# Patient Record
Sex: Male | Born: 1980
Health system: Southern US, Community
[De-identification: ages and names within clinical notes are randomized; demographics above are authoritative.]

## PROBLEM LIST (undated history)

## (undated) DIAGNOSIS — I10 Essential (primary) hypertension: Secondary | ICD-10-CM

## (undated) DIAGNOSIS — E7800 Pure hypercholesterolemia, unspecified: Secondary | ICD-10-CM

## (undated) DIAGNOSIS — N289 Disorder of kidney and ureter, unspecified: Secondary | ICD-10-CM

---

## 2003-11-26 ENCOUNTER — Emergency Department (HOSPITAL_COMMUNITY): Admission: EM | Admit: 2003-11-26 | Discharge: 2003-11-26 | Payer: Self-pay | Admitting: Emergency Medicine

## 2004-03-19 ENCOUNTER — Emergency Department (HOSPITAL_COMMUNITY): Admission: EM | Admit: 2004-03-19 | Discharge: 2004-03-19 | Payer: Self-pay | Admitting: Emergency Medicine

## 2004-07-09 ENCOUNTER — Emergency Department (HOSPITAL_COMMUNITY): Admission: EM | Admit: 2004-07-09 | Discharge: 2004-07-09 | Payer: Self-pay | Admitting: Emergency Medicine

## 2004-09-09 ENCOUNTER — Emergency Department (HOSPITAL_COMMUNITY): Admission: EM | Admit: 2004-09-09 | Discharge: 2004-09-09 | Payer: Self-pay | Admitting: Emergency Medicine

## 2004-09-12 ENCOUNTER — Emergency Department (HOSPITAL_COMMUNITY): Admission: EM | Admit: 2004-09-12 | Discharge: 2004-09-12 | Payer: Self-pay | Admitting: Emergency Medicine

## 2004-09-16 ENCOUNTER — Inpatient Hospital Stay (HOSPITAL_COMMUNITY): Admission: AD | Admit: 2004-09-16 | Discharge: 2004-09-20 | Payer: Self-pay | Admitting: General Surgery

## 2004-09-22 ENCOUNTER — Ambulatory Visit (HOSPITAL_COMMUNITY): Admission: RE | Admit: 2004-09-22 | Discharge: 2004-09-22 | Payer: Self-pay | Admitting: Family Medicine

## 2005-07-26 ENCOUNTER — Emergency Department (HOSPITAL_COMMUNITY): Admission: EM | Admit: 2005-07-26 | Discharge: 2005-07-26 | Payer: Self-pay | Admitting: Emergency Medicine

## 2005-07-31 ENCOUNTER — Emergency Department (HOSPITAL_COMMUNITY): Admission: EM | Admit: 2005-07-31 | Discharge: 2005-07-31 | Payer: Self-pay | Admitting: Emergency Medicine

## 2006-09-19 IMAGING — CR DG CHEST 2V
2 series · 2 of 2 positions shown · non-contrast
Comparison: none

CLINICAL DATA: Fever, nausea, chest pain
 CHEST ? TWO VIEWS:

[view not recorded (1 of 2)]
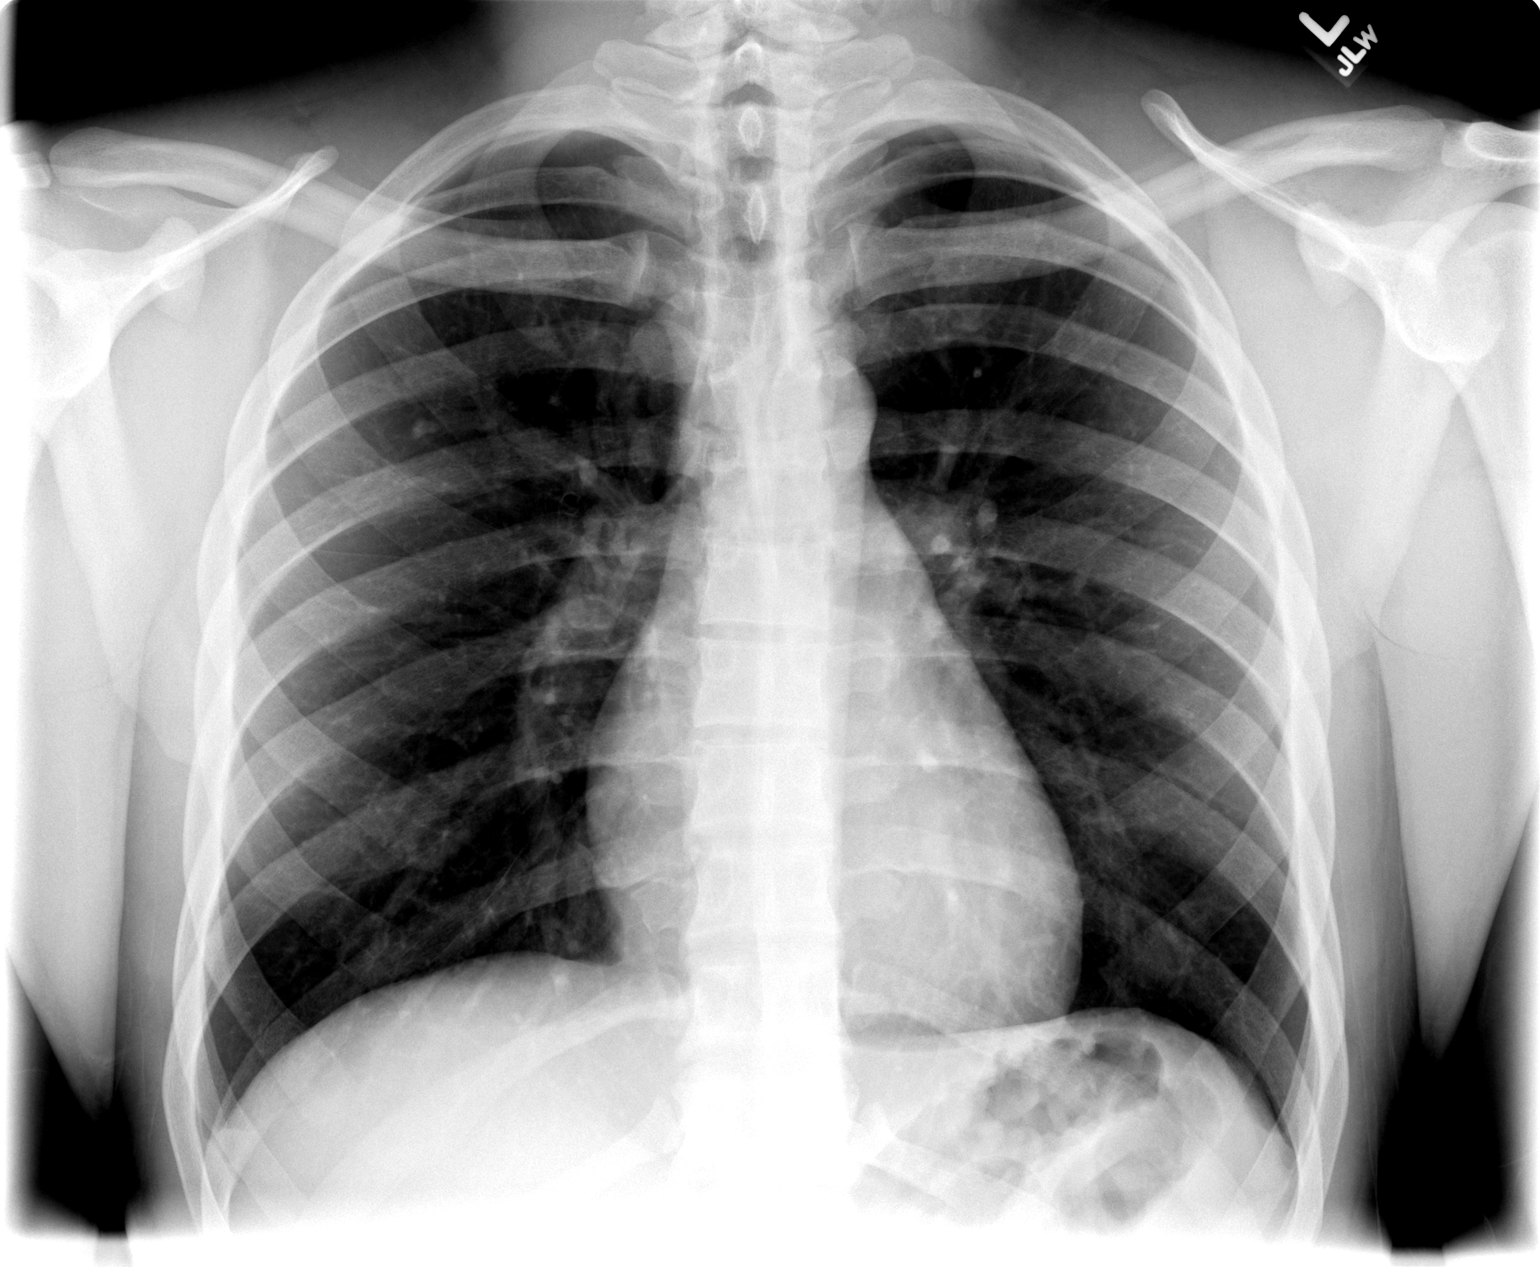

[view not recorded (2 of 2)]
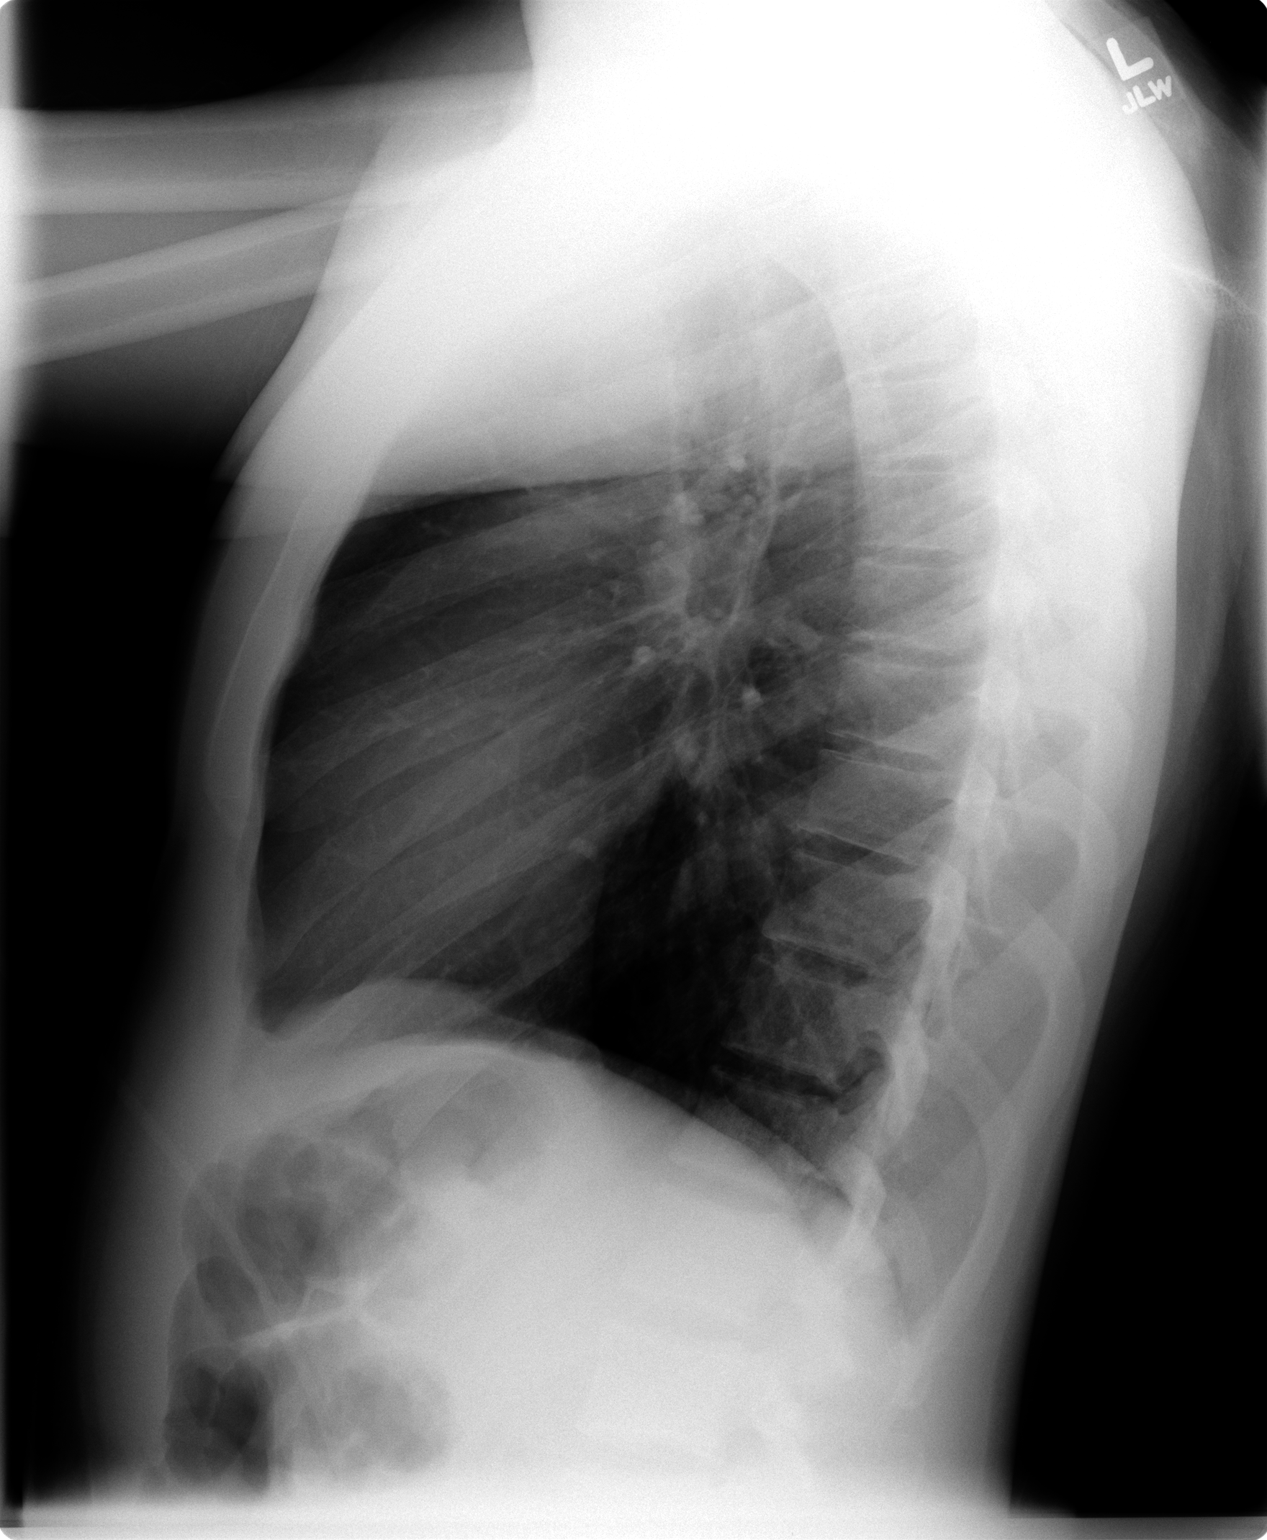

[2 of 2 positions shown; findings below may reference images not displayed]

FINDINGS: Two views of the chest show no active infiltrate or effusion.  Mild peribronchial thickening is noted.  The heart is within normal limits in size.  A small nodular opacity in the right upper lung may be vascular in origin and comparison with prior chest x-ray or followup chest x-ray is recommended.
IMPRESSION: No active lung disease.  Nodular opacity in right upper lung probably overlapping vessels.  Compare with prior or followup chest x-ray.

## 2006-09-24 IMAGING — US US RENAL
1 series · 14 of 25 positions shown · non-contrast
Comparison: none

HISTORY: Hematuria, sepsis, UTI

[Series 1: unknown · 0.27mm/px · 14 of 43 slices shown]
[im 1/43]
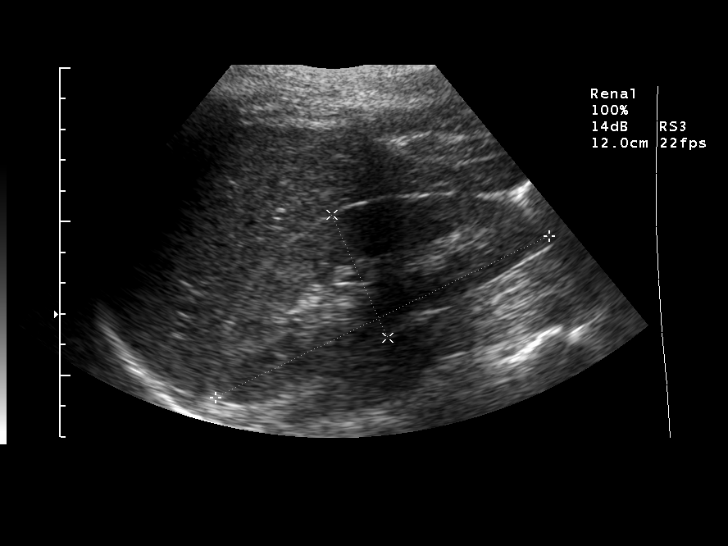
[im 4/43]
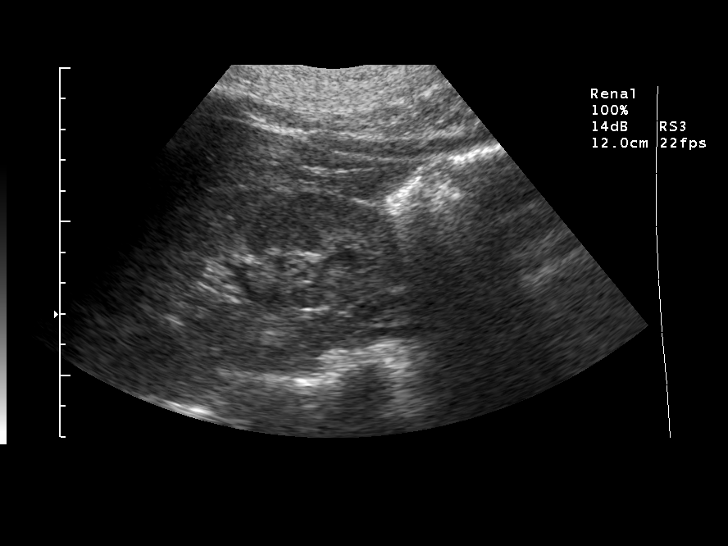
[im 8/43]
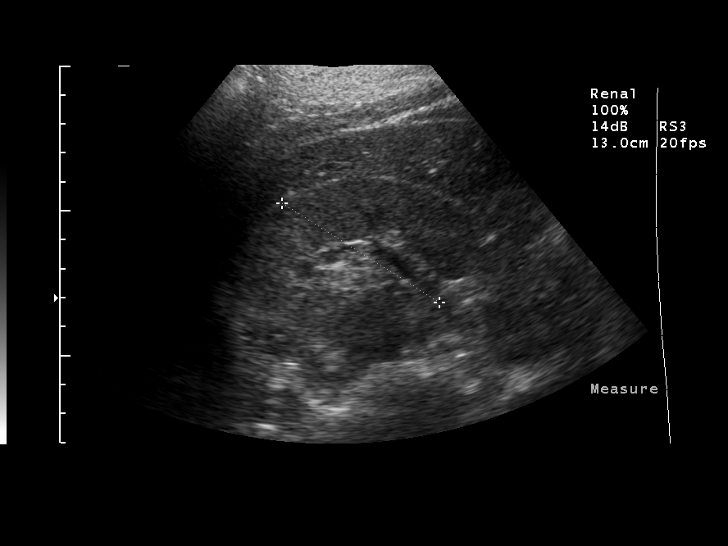
[im 11/43]
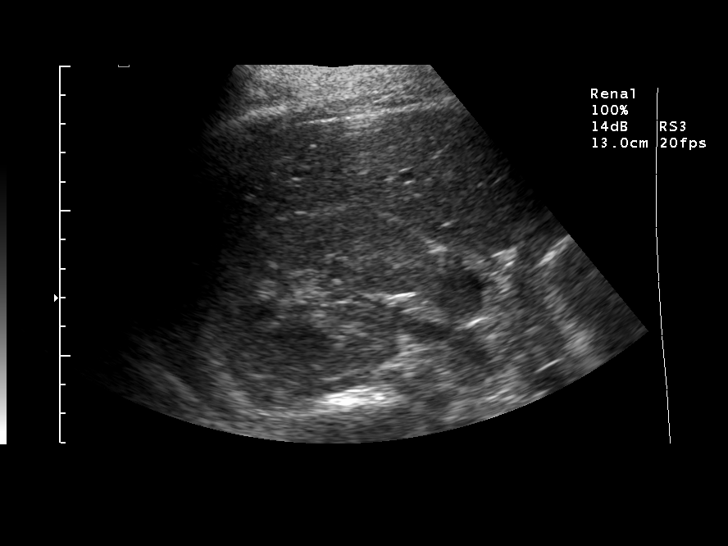
[im 15/43]
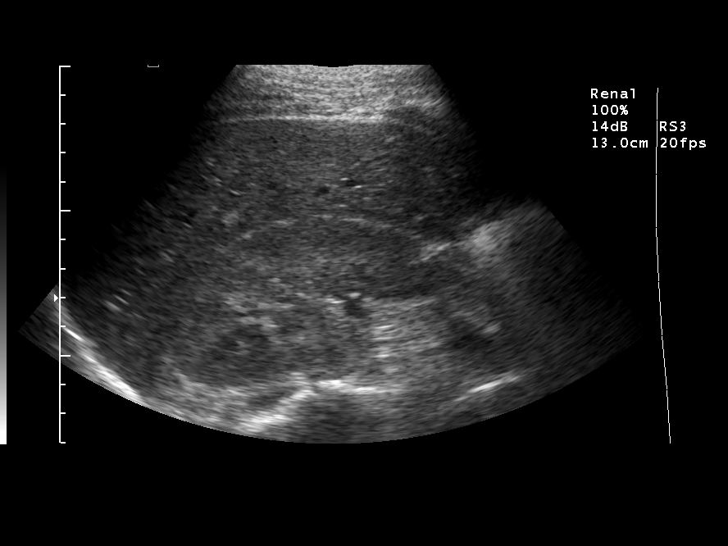
[im 16/43]
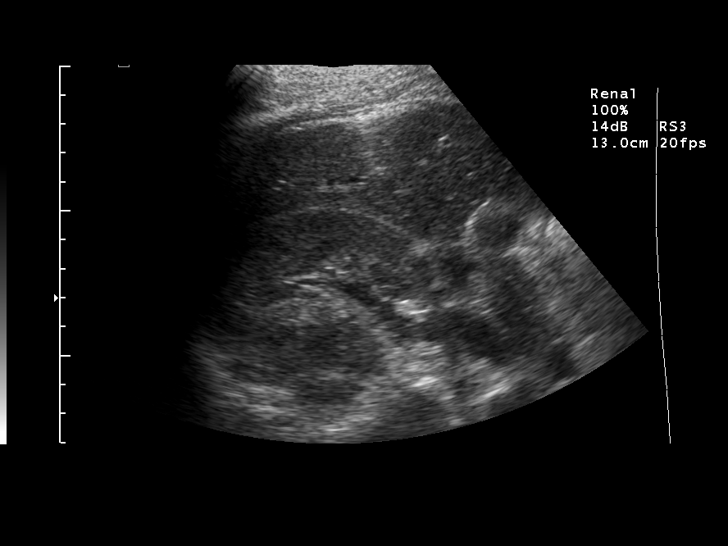
[im 20/43]
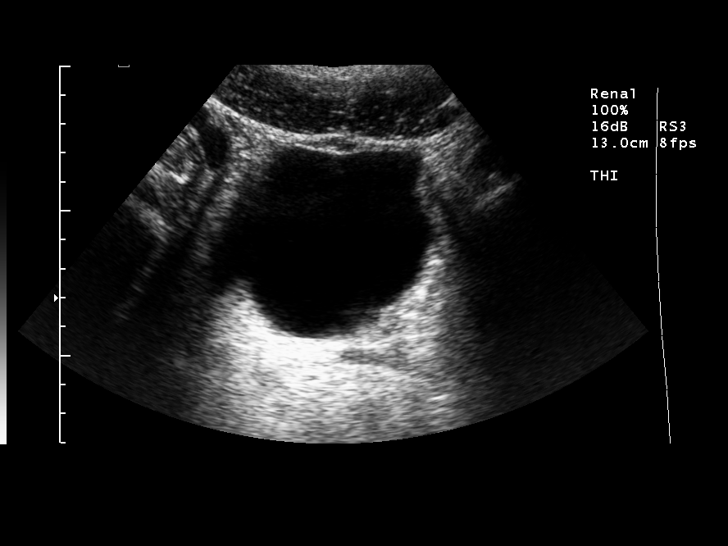
[im 23/43]
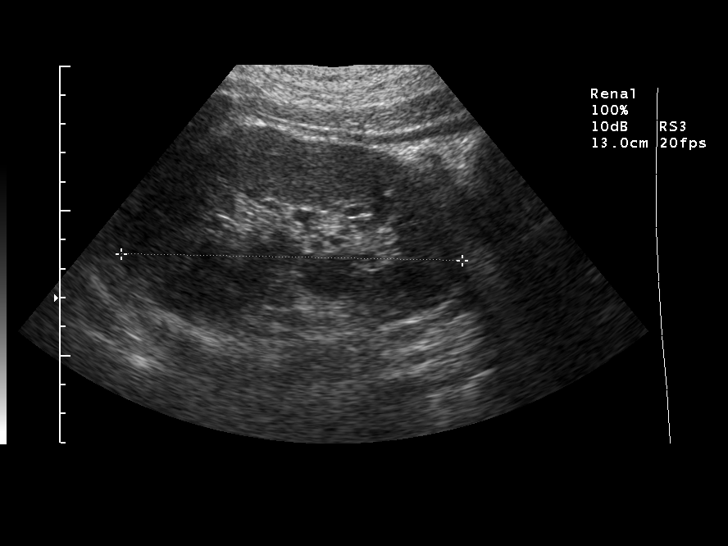
[im 27/43]
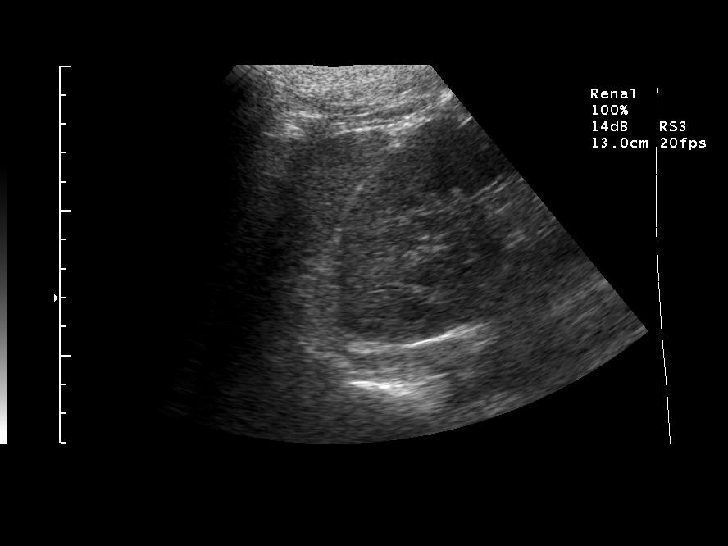
[im 29/43]
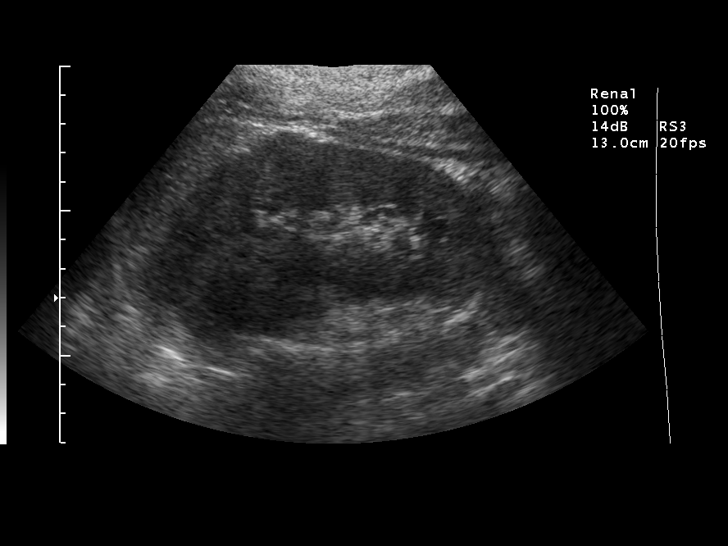
[im 32/43]
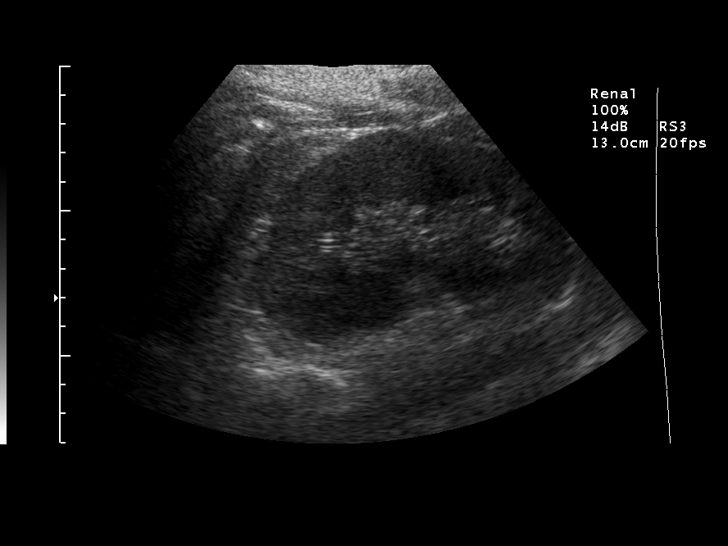
[im 36/43]
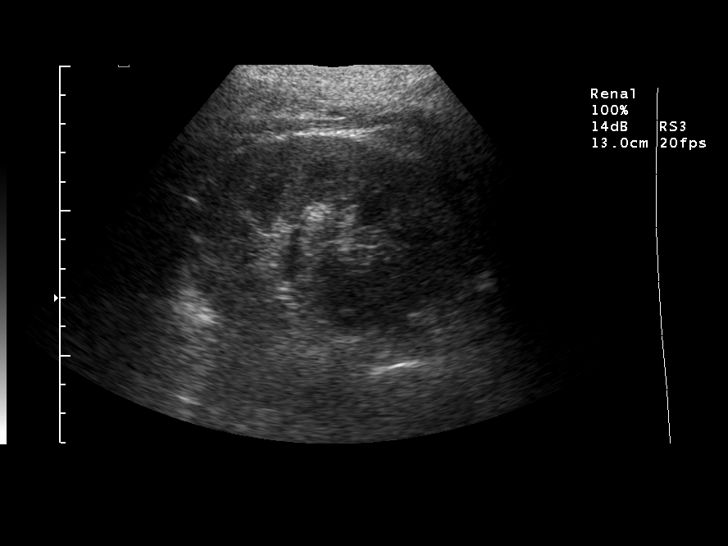
[im 39/43]
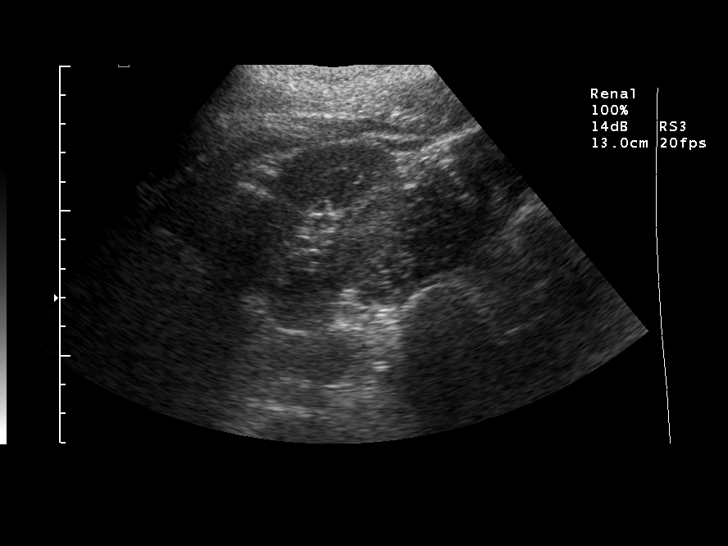
[im 43/43]
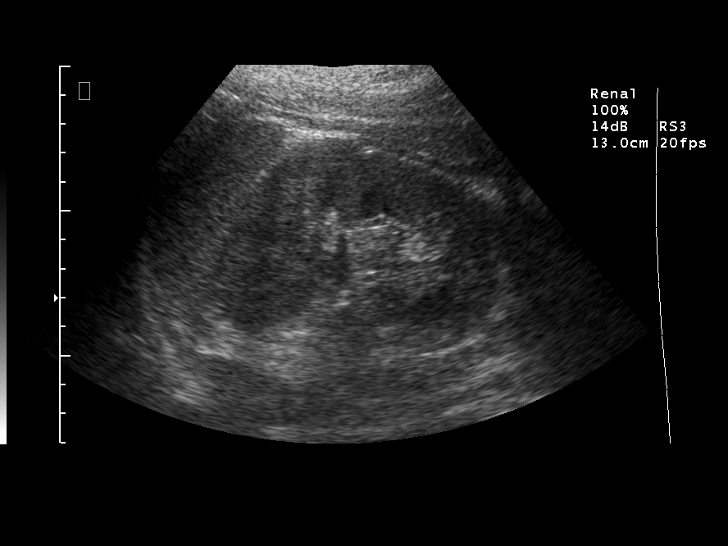

[14 of 25 positions shown; findings below may reference images not displayed]

RENAL ULTRASOUND:

Kidneys normal size, 12.0 cm length right and 11.7 cm left.
Upper normal right renal cortical echogenicity.
No solid mass or hydronephrosis.
Tiny nonshadowing echogenic foci in midleft kidney, without definite shadowing
calculus.
No perinephric fluid collection.
Bladder unremarkable.
IMPRESSION: No definite acute abnormalities as above.

## 2012-07-08 ENCOUNTER — Emergency Department (HOSPITAL_COMMUNITY)
Admission: EM | Admit: 2012-07-08 | Discharge: 2012-07-08 | Disposition: A | Discharge status: Home / Self Care | Payer: BC Managed Care – PPO | Attending: Emergency Medicine | Admitting: Emergency Medicine

## 2012-07-08 ENCOUNTER — Other Ambulatory Visit: Payer: Self-pay

## 2012-07-08 ENCOUNTER — Encounter (HOSPITAL_COMMUNITY): Payer: Self-pay | Admitting: *Deleted

## 2012-07-08 DIAGNOSIS — R55 Syncope and collapse: Secondary | ICD-10-CM | POA: Insufficient documentation

## 2012-07-08 DIAGNOSIS — Z872 Personal history of diseases of the skin and subcutaneous tissue: Secondary | ICD-10-CM | POA: Insufficient documentation

## 2012-07-08 DIAGNOSIS — F17200 Nicotine dependence, unspecified, uncomplicated: Secondary | ICD-10-CM | POA: Insufficient documentation

## 2012-07-08 DIAGNOSIS — R110 Nausea: Secondary | ICD-10-CM | POA: Insufficient documentation

## 2012-07-08 MED ORDER — IBUPROFEN 400 MG PO TABS
600.0000 mg | ORAL_TABLET | Freq: Once | ORAL | Status: AC
  Administered 2012-07-08: 600 mg via ORAL
  Filled 2012-07-08: qty 2

## 2012-07-08 NOTE — ED Notes (Signed)
Patient A/O x 3.  States prior to having abcess lanced, he was hypertensive and very anxious.  He only received Lidocaine as numbing agent.  Pt. Hypotensive upon EMS arrival at drug store.

## 2012-07-08 NOTE — ED Notes (Signed)
Patient with no complaints at this time. Respirations even and unlabored. Skin warm/dry. Discharge instructions reviewed with patient at this time. Patient given opportunity to voice concerns/ask questions. Patient discharged at this time and left Emergency Department with steady gait.   

## 2012-07-08 NOTE — ED Notes (Addendum)
Pt had abscess lanced to buttocks at Urgent Care. In car at drug store drive thru ,had syncopal episode.  Alert , appropriate now  Had not eaten prior to episode

## 2012-07-08 NOTE — ED Provider Notes (Signed)
History    32 year old male presenting after syncopal event. Patient had an abscess of his right buttock incised and drained earlier today at urgent care. He is prescribed antibiotics. He was at the pharmacy to get his antibiotics when he began to feel nauseated and lightheaded. He had a brief loss of consciousness. Denies any preceding chest pain or shortness of breath. Currently no complaints. Witnessed by family. No seizure activity. No tongue biting. No incontinence. Patient is otherwise healthy. No prior history of similar events.  CSN: [id number]  Arrival date & time 07/08/12  1452   First MD Initiated Contact with Patient 07/08/12 1612      Chief Complaint  Patient presents with  . Loss of Consciousness    (Consider location/radiation/quality/duration/timing/severity/associated sxs/prior treatment) HPI  History reviewed. No pertinent past medical history.  History reviewed. No pertinent past surgical history.  History reviewed. No pertinent family history.  History  Substance Use Topics  . Smoking status: Current Every Day Smoker  . Smokeless tobacco: Not on file  . Alcohol Use: No      Review of Systems  All systems reviewed and negative, other than as noted in HPI.    Allergies  Review of patient's allergies indicates no known allergies.  Home Medications  No current outpatient prescriptions on file.  BP 107/60  Pulse 84  Temp(Src) 100.6 F (38.1 C) (Oral)  Resp 20  Ht 5\' 11"  (1.803 m)  Wt 200 lb (90.719 kg)  BMI 27.91 kg/m2  SpO2 100%  Physical Exam  Nursing note and vitals reviewed. Constitutional: He appears well-developed and well-nourished. No distress.  HENT:  Head: Normocephalic and atraumatic.  Eyes: Conjunctivae are normal. Right eye exhibits no discharge. Left eye exhibits no discharge.  Neck: Neck supple.  Cardiovascular: Normal rate, regular rhythm and normal heart sounds.  Exam reveals no gallop and no friction rub.   No murmur  heard. Pulmonary/Chest: Effort normal and breath sounds normal. No respiratory distress.  Abdominal: Soft. He exhibits no distension. There is no tenderness.  Musculoskeletal: He exhibits no edema and no tenderness.  Lower extremities symmetric as compared to each other. No calf tenderness. Negative Homan's. No palpable cords.   Neurological: He is alert.  Skin: Skin is warm and dry.  Lesion to right buttock consistent with recently incised and drained abscess. No significant cellulitic component.  Psychiatric: He has a normal mood and affect. His behavior is normal. Thought content normal.    ED Course  Procedures (including critical care time)  Labs Reviewed - No data to display No results found.  EKG:  Rhythm: Normal sinus rhythm  Rate: 69 QRS duration: 100 ms. Incomplete right bundle branch block Axis: Normal ST segments: Very minimal ST segment elevation V1 and V2. No reciprocal changes. Comparison: None   1. Syncope       MDM  32 year old male with syncope. Possibly vasovagal related to pain her recent incision and drainage of a buttock abscess. Patient's EKG shows a sinus rhythm. Incomplete right bundle branch block pattern, but not typical of Brugada. Patient is febrile. Clinically he is very well-appearing though. He is hemodynamically stable. I do not feel that he is septic. He was prescribed antibiotics by previous provider. Instructed to take these as directed. Emergent return precautions were discussed. Continued wound care was discussed as well. Outpatient followup.        Raeford Razor, MD 07/08/12 [id number]

## 2018-01-11 ENCOUNTER — Emergency Department (HOSPITAL_COMMUNITY): Payer: BLUE CROSS/BLUE SHIELD

## 2018-01-11 ENCOUNTER — Encounter (HOSPITAL_COMMUNITY): Payer: Self-pay | Admitting: Emergency Medicine

## 2018-01-11 ENCOUNTER — Other Ambulatory Visit: Payer: Self-pay

## 2018-01-11 ENCOUNTER — Emergency Department (HOSPITAL_COMMUNITY)
Admission: EM | Admit: 2018-01-11 | Discharge: 2018-01-11 | Disposition: A | Discharge status: Home / Self Care | Payer: BLUE CROSS/BLUE SHIELD | Attending: Emergency Medicine | Admitting: Emergency Medicine

## 2018-01-11 DIAGNOSIS — R0600 Dyspnea, unspecified: Secondary | ICD-10-CM | POA: Diagnosis not present

## 2018-01-11 DIAGNOSIS — R0602 Shortness of breath: Secondary | ICD-10-CM | POA: Diagnosis not present

## 2018-01-11 DIAGNOSIS — J432 Centrilobular emphysema: Secondary | ICD-10-CM

## 2018-01-11 DIAGNOSIS — N179 Acute kidney failure, unspecified: Secondary | ICD-10-CM | POA: Insufficient documentation

## 2018-01-11 DIAGNOSIS — Z87891 Personal history of nicotine dependence: Secondary | ICD-10-CM | POA: Diagnosis not present

## 2018-01-11 HISTORY — DX: Disorder of kidney and ureter, unspecified: N28.9

## 2018-01-11 LAB — CBC WITH DIFFERENTIAL/PLATELET
ABS IMMATURE GRANULOCYTES: 0.07 10*3/uL (ref 0.00–0.07)
BASOS PCT: 1 %
Basophils Absolute: 0.1 10*3/uL (ref 0.0–0.1)
Eosinophils Absolute: 0.2 10*3/uL (ref 0.0–0.5)
Eosinophils Relative: 2 %
HCT: 50.4 % (ref 39.0–52.0)
Hemoglobin: 15.3 g/dL (ref 13.0–17.0)
IMMATURE GRANULOCYTES: 1 %
LYMPHS ABS: 2.8 10*3/uL (ref 0.7–4.0)
LYMPHS PCT: 35 %
MCH: 27.5 pg (ref 26.0–34.0)
MCHC: 30.4 g/dL (ref 30.0–36.0)
MCV: 90.5 fL (ref 80.0–100.0)
Monocytes Absolute: 0.9 10*3/uL (ref 0.1–1.0)
Monocytes Relative: 11 %
NEUTROS ABS: 4.2 10*3/uL (ref 1.7–7.7)
NEUTROS PCT: 50 %
PLATELETS: 273 10*3/uL (ref 150–400)
RBC: 5.57 MIL/uL (ref 4.22–5.81)
RDW: 14.6 % (ref 11.5–15.5)
WBC: 8.1 10*3/uL (ref 4.0–10.5)
nRBC: 0 % (ref 0.0–0.2)

## 2018-01-11 LAB — BASIC METABOLIC PANEL
ANION GAP: 6 (ref 5–15)
BUN: 14 mg/dL (ref 6–20)
CO2: 27 mmol/L (ref 22–32)
Calcium: 8.8 mg/dL — ABNORMAL LOW (ref 8.9–10.3)
Chloride: 105 mmol/L (ref 98–111)
Creatinine, Ser: 1.62 mg/dL — ABNORMAL HIGH (ref 0.61–1.24)
GFR calc Af Amer: >60 mL/min (ref >60)
GFR calc non Af Amer: 53 mL/min — ABNORMAL LOW (ref >60)
GLUCOSE: 91 mg/dL (ref 70–99)
POTASSIUM: 4 mmol/L (ref 3.5–5.1)
Sodium: 138 mmol/L (ref 135–145)

## 2018-01-11 LAB — TROPONIN I

## 2018-01-11 NOTE — ED Triage Notes (Signed)
Pt c/o sudden sob x 20 minutes ago. States felt heart beating fast. Denies feeling his heart beating fast now. States it would come and go  Denies pain/dizziness/nausea. No obvious sob noted. Lung sounds clear in triage

## 2018-01-11 NOTE — Discharge Instructions (Addendum)
Follow-up with your doctor for both the elevated blood pressure and the lung findings on the x-ray and CAT scan.

## 2018-01-11 NOTE — ED Provider Notes (Signed)
[facility] EMERGENCY DEPARTMENT Provider Note   CSN: [id number] Arrival date & time: 01/11/18  5732     History   Chief Complaint Chief Complaint  Patient presents with  . Shortness of Breath    HPI Micheal Carrillo is a 37 y.o. male.  HPI Patient presented with shortness of breath.  Lasted about 20 minutes at home prior to arrival.  States he just walked upstairs began to feel short of breath.  States he felt as if his heart was racing fast.  States he felt anxious.  Has not had a history of anxiety.  States he had smoked some marijuana about an hour prior to this.  He has not had any problems with that in the past.  States he went outside to get some air felt better came back and began again.  Now feeling back to baseline.  No swelling in his legs.  No chest pain.  Has not had episodes like this before.  No weight loss.  No swelling in his legs. Past Medical History:  Diagnosis Date  . Renal disorder     There are no active problems to display for this patient.   History reviewed. No pertinent surgical history.      Home Medications    Prior to Admission medications   Not on File    Family History History reviewed. No pertinent family history.  Social History Social History   Tobacco Use  . Smoking status: Former Smoker    Last attempt to quit: 04/13/2013    Years since quitting: 4.7  . Smokeless tobacco: Never Used  Substance Use Topics  . Alcohol use: No  . Drug use: Yes    Types: Marijuana    Comment: last used today     Allergies   Patient has no known allergies.   Review of Systems Review of Systems  Constitutional: Negative for chills and unexpected weight change.  HENT: Negative for congestion.   Respiratory: Positive for shortness of breath.   Cardiovascular: Positive for palpitations.  Gastrointestinal: Negative for abdominal pain.  Endocrine: Negative for polyuria.  Genitourinary: Negative for flank pain.  Musculoskeletal: Negative for  back pain.  Skin: Negative for rash.  Neurological: Negative for weakness.  Psychiatric/Behavioral: Negative for confusion.     Physical Exam Updated Vital Signs BP (!) 162/123   Pulse (!) 59   Temp 97.9 F (36.6 C) (Oral)   Resp 14   Ht 5\' 11"  (1.803 m)   Wt 90.7 kg   SpO2 100%   BMI 27.89 kg/m   Physical Exam  Constitutional: He appears well-developed.  HENT:  Head: Atraumatic.  Eyes: Pupils are equal, round, and reactive to light.  Neck: Neck supple.  Cardiovascular: Regular rhythm.  Pulmonary/Chest: Effort normal.  Abdominal: Soft. There is no tenderness.  Musculoskeletal:       Right lower leg: He exhibits no edema.       Left lower leg: He exhibits no edema.  Neurological: He is alert.  Skin: Skin is warm. Capillary refill takes less than 2 seconds.     ED Treatments / Results  Labs (all labs ordered are listed, but only abnormal results are displayed) Labs Reviewed  BASIC METABOLIC PANEL - Abnormal; Notable for the following components:      Result Value   Creatinine, Ser 1.62 (*)    Calcium 8.8 (*)    GFR calc non Af Amer 53 (*)    All other components within normal limits  CBC WITH DIFFERENTIAL/PLATELET  TROPONIN I    EKG None ED ECG REPORT   Date: 01/11/2018  Rate: 57  Rhythm: normal sinus rhythm  QRS Axis: normal  Intervals: normal  ST/T Wave abnormalities: nonspecific ST/T changes  Conduction Disutrbances:none  Narrative Interpretation:   Old EKG Reviewed: changes noted nonspecific inferior changes.   Radiology Dg Chest 2 View  Result Date: 01/11/2018 CLINICAL DATA:  Shortness of breath. EXAM: CHEST - 2 VIEW COMPARISON:  September 12, 2004 FINDINGS: Possible bulla in the apices, left greater than right. No convincing evidence of pneumothorax. The heart, hila, and mediastinum are normal. No pulmonary nodules or masses. No focal infiltrates. IMPRESSION: Possible bulla in the apices.  No other acute abnormalities. Electronically Signed   By:  Gerome Samavid  Williams III M.D   On: 01/11/2018 20:41   Ct Chest Wo Contrast  Result Date: 01/11/2018 CLINICAL DATA:  Shortness of breath. EXAM: CT CHEST WITHOUT CONTRAST TECHNIQUE: Multidetector CT imaging of the chest was performed following the standard protocol without IV contrast. COMPARISON:  Chest x-ray January 11, 2018 FINDINGS: Cardiovascular: The heart size is normal. No obvious coronary artery calcifications noted. The thoracic aorta and pulmonary arteries are normal in caliber. Mediastinum/Nodes: No enlarged mediastinal or axillary lymph nodes. Thyroid gland, trachea, and esophagus demonstrate no significant findings. Lungs/Pleura: Central airways are normal. No pneumothorax. Bulla are seen in the apices. Central lobular emphysematous changes are noted, most prominent in the upper lobes but also involving the right middle and bilateral lower lobes. No nodules or masses. Mild dependent atelectasis. Upper Abdomen: A few scattered low-attenuation lesions are seen in the liver, likely cysts but too small to characterize. Musculoskeletal: No chest wall mass or suspicious bone lesions identified. IMPRESSION: 1. Bulla in the apices. Central lobular emphysematous changes in the lungs, upper lobe predominant. The degree of emphysema is atypical for the patient's young age. 2. No other acute abnormalities. Electronically Signed   By: Gerome Samavid  Williams III M.D   On: 01/11/2018 21:59    Procedures Procedures (including critical care time)  Medications Ordered in ED Medications - No data to display   Initial Impression / Assessment and Plan / ED Course  I have reviewed the triage vital signs and the nursing notes.  Pertinent labs & imaging results that were available during my care of the patient were reviewed by me and considered in my medical decision making (see chart for details).     Patient with episode of shortness of breath.  Held his heart racing.  Could have been a tachycardia versus anxiety.   Has smoked some marijuana shortly before.  EKG reassuring.  Blood work shows a renal insufficiency.  Patient states he has had this before but cannot really quantify his numbers.  X-ray showed bulla however.  CT scan done to further evaluate.  Does show a central lobular emphysema with bulla.  We [facility] an [facility]  Final Clinical Impressions(s) / ED Diagnoses   Final diagnoses:  Dyspnea, unspecified type  Centrilobular emphysema [facility](HCC)    ED Discharge Orders    None       Benjiman CorePickering, Tex Conroy, MD 01/11/18 2338

## 2018-01-13 DIAGNOSIS — Z6829 Body mass index (BMI) 29.0-29.9, adult: Secondary | ICD-10-CM | POA: Diagnosis not present

## 2018-01-13 DIAGNOSIS — J439 Emphysema, unspecified: Secondary | ICD-10-CM | POA: Diagnosis not present

## 2018-01-13 DIAGNOSIS — Z1389 Encounter for screening for other disorder: Secondary | ICD-10-CM | POA: Diagnosis not present

## 2018-01-13 DIAGNOSIS — E663 Overweight: Secondary | ICD-10-CM | POA: Diagnosis not present

## 2018-02-04 DIAGNOSIS — J984 Other disorders of lung: Secondary | ICD-10-CM | POA: Diagnosis not present

## 2018-02-09 ENCOUNTER — Other Ambulatory Visit (HOSPITAL_COMMUNITY): Payer: Self-pay | Admitting: Respiratory Therapy

## 2018-02-09 DIAGNOSIS — R0602 Shortness of breath: Secondary | ICD-10-CM

## 2018-03-09 ENCOUNTER — Ambulatory Visit (HOSPITAL_COMMUNITY)
Admission: RE | Admit: 2018-03-09 | Discharge: 2018-03-09 | Disposition: A | Discharge status: Home / Self Care | Payer: BLUE CROSS/BLUE SHIELD | Source: Ambulatory Visit | Attending: Pulmonary Disease | Admitting: Pulmonary Disease

## 2018-03-09 DIAGNOSIS — R0602 Shortness of breath: Secondary | ICD-10-CM | POA: Insufficient documentation

## 2018-03-09 LAB — PULMONARY FUNCTION TEST
DL/VA % pred: 89 %
DL/VA: 4.27 ml/min/mmHg/L
DLCO UNC % PRED: 70 %
DLCO unc: 24.72 ml/min/mmHg
FEF 25-75 PRE: 3.01 L/s
FEF 25-75 Post: 4.31 L/sec
FEF2575-%Change-Post: 42 %
FEF2575-%PRED-POST: 105 %
FEF2575-%Pred-Pre: 73 %
FEV1-%Change-Post: 9 %
FEV1-%PRED-POST: 101 %
FEV1-%PRED-PRE: 92 %
FEV1-POST: 3.96 L
FEV1-PRE: 3.62 L
FEV1FVC-%Change-Post: 1 %
FEV1FVC-%PRED-PRE: 91 %
FEV6-%CHANGE-POST: 6 %
FEV6-%PRED-POST: 107 %
FEV6-%Pred-Pre: 100 %
FEV6-POST: 5 L
FEV6-PRE: 4.68 L
FEV6FVC-%Change-Post: -1 %
FEV6FVC-%PRED-POST: 98 %
FEV6FVC-%PRED-PRE: 99 %
FVC-%CHANGE-POST: 8 %
FVC-%PRED-POST: 109 %
FVC-%Pred-Pre: 101 %
FVC-Post: 5.19 L
FVC-Pre: 4.8 L
POST FEV1/FVC RATIO: 76 %
PRE FEV6/FVC RATIO: 98 %
Post FEV6/FVC ratio: 96 %
Pre FEV1/FVC ratio: 75 %
RV % pred: 137 %
RV: 2.58 L
TLC % pred: 102 %
TLC: 7.48 L

## 2018-03-09 MED ORDER — ALBUTEROL SULFATE (2.5 MG/3ML) 0.083% IN NEBU
2.5000 mg | INHALATION_SOLUTION | Freq: Once | RESPIRATORY_TRACT | Status: AC
  Administered 2018-03-09: 2.5 mg via RESPIRATORY_TRACT

## 2018-04-14 DIAGNOSIS — J439 Emphysema, unspecified: Secondary | ICD-10-CM | POA: Diagnosis not present

## 2018-04-14 DIAGNOSIS — J984 Other disorders of lung: Secondary | ICD-10-CM | POA: Diagnosis not present

## 2018-06-02 DIAGNOSIS — J449 Chronic obstructive pulmonary disease, unspecified: Secondary | ICD-10-CM | POA: Diagnosis not present

## 2018-07-29 DIAGNOSIS — F419 Anxiety disorder, unspecified: Secondary | ICD-10-CM | POA: Diagnosis not present

## 2018-07-29 DIAGNOSIS — J439 Emphysema, unspecified: Secondary | ICD-10-CM | POA: Diagnosis not present

## 2018-09-01 DIAGNOSIS — J449 Chronic obstructive pulmonary disease, unspecified: Secondary | ICD-10-CM | POA: Diagnosis not present

## 2018-10-13 DIAGNOSIS — J439 Emphysema, unspecified: Secondary | ICD-10-CM | POA: Diagnosis not present

## 2018-10-18 ENCOUNTER — Other Ambulatory Visit (HOSPITAL_COMMUNITY): Payer: Self-pay

## 2018-10-18 DIAGNOSIS — J438 Other emphysema: Secondary | ICD-10-CM

## 2018-10-19 ENCOUNTER — Other Ambulatory Visit (HOSPITAL_COMMUNITY): Payer: Self-pay | Admitting: Pulmonary Disease

## 2018-10-19 ENCOUNTER — Ambulatory Visit (HOSPITAL_COMMUNITY)
Admission: RE | Admit: 2018-10-19 | Discharge: 2018-10-19 | Disposition: A | Discharge status: Home / Self Care | Payer: BC Managed Care – PPO | Source: Ambulatory Visit | Attending: Pulmonary Disease | Admitting: Pulmonary Disease

## 2018-10-19 ENCOUNTER — Other Ambulatory Visit: Payer: Self-pay

## 2018-10-19 DIAGNOSIS — J438 Other emphysema: Secondary | ICD-10-CM

## 2018-10-19 DIAGNOSIS — J984 Other disorders of lung: Secondary | ICD-10-CM | POA: Diagnosis not present

## 2018-10-19 DIAGNOSIS — J439 Emphysema, unspecified: Secondary | ICD-10-CM | POA: Diagnosis not present

## 2018-11-08 ENCOUNTER — Other Ambulatory Visit (HOSPITAL_COMMUNITY)
Admission: RE | Admit: 2018-11-08 | Discharge: 2018-11-08 | Disposition: A | Discharge status: Home / Self Care | Payer: BC Managed Care – PPO | Source: Ambulatory Visit | Attending: Pulmonary Disease | Admitting: Pulmonary Disease

## 2018-11-08 ENCOUNTER — Other Ambulatory Visit: Payer: Self-pay

## 2018-11-08 DIAGNOSIS — Z20828 Contact with and (suspected) exposure to other viral communicable diseases: Secondary | ICD-10-CM | POA: Insufficient documentation

## 2018-11-08 DIAGNOSIS — Z01812 Encounter for preprocedural laboratory examination: Secondary | ICD-10-CM | POA: Insufficient documentation

## 2018-11-09 ENCOUNTER — Other Ambulatory Visit: Payer: Self-pay

## 2018-11-09 ENCOUNTER — Ambulatory Visit (HOSPITAL_COMMUNITY)
Admission: RE | Admit: 2018-11-09 | Discharge: 2018-11-09 | Disposition: A | Discharge status: Home / Self Care | Payer: BC Managed Care – PPO | Source: Ambulatory Visit | Attending: Pulmonary Disease | Admitting: Pulmonary Disease

## 2018-11-09 DIAGNOSIS — J438 Other emphysema: Secondary | ICD-10-CM | POA: Insufficient documentation

## 2018-11-09 LAB — PULMONARY FUNCTION TEST
DL/VA % pred: 94 %
DL/VA: 4.4 ml/min/mmHg/L
DLCO unc % pred: 75 %
DLCO unc: 25.34 ml/min/mmHg
FEF 25-75 Post: 3 L/sec
FEF 25-75 Pre: 2.6 L/sec
FEF2575-%Change-Post: 15 %
FEF2575-%Pred-Post: 74 %
FEF2575-%Pred-Pre: 64 %
FEV1-%Change-Post: 4 %
FEV1-%Pred-Post: 98 %
FEV1-%Pred-Pre: 94 %
FEV1-Post: 3.81 L
FEV1-Pre: 3.66 L
FEV1FVC-%Change-Post: 8 %
FEV1FVC-%Pred-Pre: 87 %
FEV6-%Change-Post: -2 %
FEV6-%Pred-Post: 102 %
FEV6-%Pred-Pre: 105 %
FEV6-Post: 4.76 L
FEV6-Pre: 4.87 L
FEV6FVC-%Change-Post: 2 %
FEV6FVC-%Pred-Post: 99 %
FEV6FVC-%Pred-Pre: 97 %
FVC-%Change-Post: -4 %
FVC-%Pred-Post: 103 %
FVC-%Pred-Pre: 108 %
FVC-Post: 4.89 L
FVC-Pre: 5.1 L
Post FEV1/FVC ratio: 78 %
Post FEV6/FVC ratio: 98 %
Pre FEV1/FVC ratio: 72 %
Pre FEV6/FVC Ratio: 96 %
RV % pred: 89 %
RV: 1.7 L
TLC % pred: 88 %
TLC: 6.46 L

## 2018-11-09 LAB — SARS CORONAVIRUS 2 (TAT 6-24 HRS): SARS Coronavirus 2: NEGATIVE

## 2018-11-09 MED ORDER — ALBUTEROL SULFATE (2.5 MG/3ML) 0.083% IN NEBU: 2.5000 mg | INHALATION_SOLUTION | Freq: Once | RESPIRATORY_TRACT | Status: DC

## 2019-02-07 DIAGNOSIS — J439 Emphysema, unspecified: Secondary | ICD-10-CM | POA: Diagnosis not present

## 2019-04-17 ENCOUNTER — Other Ambulatory Visit: Payer: Self-pay

## 2019-04-17 ENCOUNTER — Ambulatory Visit: Payer: 59 | Attending: Internal Medicine

## 2019-04-17 DIAGNOSIS — Z20822 Contact with and (suspected) exposure to covid-19: Secondary | ICD-10-CM

## 2019-04-17 DIAGNOSIS — U071 COVID-19: Secondary | ICD-10-CM | POA: Insufficient documentation

## 2019-04-18 LAB — NOVEL CORONAVIRUS, NAA: SARS-CoV-2, NAA: DETECTED — AB

## 2019-10-13 ENCOUNTER — Other Ambulatory Visit (HOSPITAL_COMMUNITY): Payer: Self-pay | Admitting: Family Medicine

## 2019-10-13 DIAGNOSIS — R9389 Abnormal findings on diagnostic imaging of other specified body structures: Secondary | ICD-10-CM

## 2019-11-27 ENCOUNTER — Other Ambulatory Visit: Payer: Self-pay

## 2019-11-27 ENCOUNTER — Ambulatory Visit
Admission: EM | Admit: 2019-11-27 | Discharge: 2019-11-27 | Disposition: A | Discharge status: Home / Self Care | Payer: 59

## 2019-11-27 ENCOUNTER — Encounter: Payer: Self-pay | Admitting: Emergency Medicine

## 2019-11-27 DIAGNOSIS — R109 Unspecified abdominal pain: Secondary | ICD-10-CM

## 2019-11-27 DIAGNOSIS — R319 Hematuria, unspecified: Secondary | ICD-10-CM

## 2019-11-27 HISTORY — DX: Essential (primary) hypertension: I10

## 2019-11-27 HISTORY — DX: Pure hypercholesterolemia, unspecified: E78.00

## 2019-11-27 LAB — POCT URINALYSIS DIP (MANUAL ENTRY)
Bilirubin, UA: NEGATIVE
Glucose, UA: NEGATIVE mg/dL
Ketones, POC UA: NEGATIVE mg/dL
Leukocytes, UA: NEGATIVE
Nitrite, UA: NEGATIVE
Protein Ur, POC: >=300 mg/dL — AB
Spec Grav, UA: >=1.03 — AB (ref 1.010–1.025)
Urobilinogen, UA: 0.2 E.U./dL
pH, UA: 5 (ref 5.0–8.0)

## 2019-11-27 MED ORDER — TAMSULOSIN HCL 0.4 MG PO CAPS: 0.4000 mg | ORAL_CAPSULE | Freq: Every day | ORAL | 0 refills | Status: DC

## 2019-11-27 MED ORDER — KETOROLAC TROMETHAMINE 60 MG/2ML IM SOLN
60.0000 mg | Freq: Once | INTRAMUSCULAR | Status: AC
  Administered 2019-11-27: 60 mg via INTRAMUSCULAR

## 2019-11-27 MED ORDER — IBUPROFEN 800 MG PO TABS: 800.0000 mg | ORAL_TABLET | Freq: Three times a day (TID) | ORAL | 0 refills | Status: AC

## 2019-11-27 MED ORDER — TRAMADOL HCL 50 MG PO TABS: 50.0000 mg | ORAL_TABLET | Freq: Two times a day (BID) | ORAL | 0 refills | Status: DC | PRN

## 2019-11-27 NOTE — ED Provider Notes (Signed)
[facility] CARE CENTER   [id number] 11/27/19 Arrival Time: 1745  CC: Back PAIN  SUBJECTIVE: History from: patient. Micheal Carrillo is a 39 y.o. male complains of RT low back pain x 3 days.  Denies a precipitating event or specific injury.  Localizes the pain to the RT low back.  Describes the pain as constant and dull in character.  Has NOT tried OTC medications.  Symptoms are made worse with movement.  Denies similar symptoms in the past.  Denies fever, chills, erythema, ecchymosis, effusion, weakness, numbness and tingling, saddle paresthesias, loss of bowel or bladder function, urinary frequency, urinary urgency, blood in urine.      ROS: As per HPI.  All other pertinent ROS negative.     Past Medical History:  Diagnosis Date  . High cholesterol   . Hypertension   . Renal disorder    History reviewed. No pertinent surgical history. No Known Allergies No current facility-administered medications on file prior to encounter.   Current Outpatient Medications on File Prior to Encounter  Medication Sig Dispense Refill  . atorvastatin (LIPITOR) 20 MG tablet Take 20 mg by mouth daily.    Marland Kitchen lisinopril (ZESTRIL) 5 MG tablet Take 5 mg by mouth daily.     Social History   Socioeconomic History  . Marital status: Married    Spouse name: Not on file  . Number of children: Not on file  . Years of education: Not on file  . Highest education level: Not on file  Occupational History  . Not on file  Tobacco Use  . Smoking status: Former Smoker    Quit date: 04/13/2013    Years since quitting: 6.6  . Smokeless tobacco: Never Used  Vaping Use  . Vaping Use: Every day  Substance and Sexual Activity  . Alcohol use: No  . Drug use: Yes    Types: Marijuana    Comment: last used today  . Sexual activity: Not on file  Other Topics Concern  . Not on file  Social History Narrative  . Not on file   Social Determinants of Health   Financial Resource Strain:   . Difficulty of Paying Living  Expenses: Not on file  Food Insecurity:   . Worried About Programme researcher, broadcasting/film/video in the Last Year: Not on file  . Ran Out of Food in the Last Year: Not on file  Transportation Needs:   . Lack of Transportation (Medical): Not on file  . Lack of Transportation (Non-Medical): Not on file  Physical Activity:   . Days of Exercise per Week: Not on file  . Minutes of Exercise per Session: Not on file  Stress:   . Feeling of Stress : Not on file  Social Connections:   . Frequency of Communication with Friends and Family: Not on file  . Frequency of Social Gatherings with Friends and Family: Not on file  . Attends Religious Services: Not on file  . Active Member of Clubs or Organizations: Not on file  . Attends Banker Meetings: Not on file  . Marital Status: Not on file  Intimate Partner Violence:   . Fear of Current or Ex-Partner: Not on file  . Emotionally Abused: Not on file  . Physically Abused: Not on file  . Sexually Abused: Not on file   No family history on file.  OBJECTIVE:  Vitals:   11/27/19 1813 11/27/19 1814  BP: (!) 149/94   Pulse: 74   Resp: 18  Temp: 98.9 F (37.2 C)   TempSrc: Oral   SpO2: 97%   Weight:  220 lb (99.8 kg)  Height:  5\' 11"  (1.803 m)    General appearance: ALERT; in no acute distress.  Head: NCAT Lungs: Normal respiratory effort Musculoskeletal: Back Inspection: Skin warm, dry, clear and intact without obvious erythema, effusion, or ecchymosis.  Palpation: Nontender to palpation; no CVA tenderness ROM: FROM active and passive Strength: 5/5 shld abduction, 5/5 shld adduction, 5/5 elbow flexion, 5/5 elbow extension, 5/5 grip strength, 5/5 hip flexion, 5/5 hip extension Skin: warm and dry Neurologic: Ambulates without difficulty; Sensation intact about the upper/ lower extremities Psychological: alert and cooperative; normal mood and affect  LABS:   Results for orders placed or performed during the hospital encounter of 11/27/19  (from the past 24 hour(s))  POCT urinalysis dipstick     Status: Abnormal   Collection Time: 11/27/19  6:26 PM  Result Value Ref Range   Color, UA yellow yellow   Clarity, UA clear clear   Glucose, UA negative negative mg/dL   Bilirubin, UA negative negative   Ketones, POC UA negative negative mg/dL   Spec Grav, UA 11/29/19 (A) 1.010 - 1.025   Blood, UA large (A) negative   pH, UA 5.0 5.0 - 8.0   Protein Ur, POC >=300 (A) negative mg/dL   Urobilinogen, UA 0.2 0.2 or 1.0 E.U./dL   Nitrite, UA Negative Negative   Leukocytes, UA Negative Negative     ASSESSMENT & PLAN:  1. Right flank pain   2. Hematuria, unspecified type     Meds ordered this encounter  Medications  . ibuprofen (ADVIL) 800 MG tablet    Sig: Take 1 tablet (800 mg total) by mouth 3 (three) times daily.    Dispense:  21 tablet    Refill:  0    Order Specific Question:   Supervising Provider    Answer:   >=7.048 Eustace Moore  . tamsulosin (FLOMAX) 0.4 MG CAPS capsule    Sig: Take 1 capsule (0.4 mg total) by mouth daily after breakfast.    Dispense:  30 capsule    Refill:  0    Order Specific Question:   Supervising Provider    Answer:   [[id number]] Eustace Moore  . traMADol (ULTRAM) 50 MG tablet    Sig: Take 1 tablet (50 mg total) by mouth every 12 (twelve) hours as needed.    Dispense:  10 tablet    Refill:  0    Order Specific Question:   Supervising Provider    Answer:   [[id number]] Eustace Moore  . ketorolac (TORADOL) injection 60 mg   Large blood in urine this may be a sign of kidney stone Drink plenty of fluids and get rest Strain all urine and follow up with PCP with specimen Use flomax as directed  Ibuprofen prescribed.   Tramadol for severe break-through pain.  DO NOT DRINK OR DRIVE while taking this medication Follow up with PCP if symptoms persist Return or go to ER if you have any new or worsening symptoms (difficulty urinating, blood in urine, pain that does not moderate with  medication, fever, chills, abdominal pain, etc...)    Reviewed expectations re: course of current medical issues. Questions answered. Outlined signs and symptoms indicating need for more acute intervention. Patient verbalized understanding. After Visit Summary given.    [[id number]], PA-C 11/27/19 1830

## 2019-11-27 NOTE — Discharge Instructions (Signed)
Large blood in urine this may be a sign of kidney stone Drink plenty of fluids and get rest Strain all urine and follow up with PCP with specimen Use flomax as directed  Ibuprofen prescribed.   Tramadol for severe break-through pain.  DO NOT DRINK OR DRIVE while taking this medication Follow up with PCP if symptoms persist Return or go to ER if you have any new or worsening symptoms (difficulty urinating, blood in urine, pain that does not moderate with medication, fever, chills, abdominal pain, etc...)

## 2019-11-27 NOTE — ED Triage Notes (Signed)
RT flank pain with movement since Friday. Denies any injury, burning w/ urination or frequency.

## 2020-02-03 ENCOUNTER — Other Ambulatory Visit: Payer: Self-pay

## 2020-02-03 ENCOUNTER — Encounter: Payer: Self-pay | Admitting: Emergency Medicine

## 2020-02-03 ENCOUNTER — Ambulatory Visit
Admission: EM | Admit: 2020-02-03 | Discharge: 2020-02-03 | Disposition: A | Discharge status: Home / Self Care | Payer: 59 | Attending: Emergency Medicine | Admitting: Emergency Medicine

## 2020-02-03 DIAGNOSIS — I10 Essential (primary) hypertension: Secondary | ICD-10-CM

## 2020-02-03 MED ORDER — CLONIDINE HCL 0.1 MG PO TABS
0.1000 mg | ORAL_TABLET | Freq: Once | ORAL | Status: AC
  Administered 2020-02-03: 0.1 mg via ORAL

## 2020-02-03 MED ORDER — ONDANSETRON 4 MG PO TBDP: 4.0000 mg | ORAL_TABLET | Freq: Three times a day (TID) | ORAL | 0 refills | Status: DC | PRN

## 2020-02-03 MED ORDER — LISINOPRIL 10 MG PO TABS: 10.0000 mg | ORAL_TABLET | Freq: Every day | ORAL | 0 refills | Status: DC

## 2020-02-03 NOTE — ED Triage Notes (Signed)
Patient states that his bp was really high "150/89". and also has rt side abcess that has drained and began to heal. has had some chills .

## 2020-02-03 NOTE — ED Provider Notes (Addendum)
[facility]   [id number] 02/03/20 Arrival Time: 1323   Chief Complaint  Patient presents with  . Hypertension     SUBJECTIVE: History from: patient.  Micheal Carrillo is a 39 y.o. male who presents to the urgent care for complaint of high blood pressure at home.  Stated blood pressure at home was 150/89.  Stated he does not know his average blood pressure.  Takes lisinopril 5 mg that he was prescribed a month ago. He does have a PCP.  He is reporting nausea symptom.  Denies HA, vision changes, dizziness, lightheadedness, chest pain, shortness of breath, numbness or tingling in extremities, abdominal pain, changes in bowel or bladder habits.    ROS: As per HPI.  All other pertinent ROS negative.     Past Medical History:  Diagnosis Date  . High cholesterol   . Hypertension   . Renal disorder    History reviewed. No pertinent surgical history. No Known Allergies No current facility-administered medications on file prior to encounter.   Current Outpatient Medications on File Prior to Encounter  Medication Sig Dispense Refill  . atorvastatin (LIPITOR) 20 MG tablet Take 20 mg by mouth daily.    Marland Kitchen ibuprofen (ADVIL) 800 MG tablet Take 1 tablet (800 mg total) by mouth 3 (three) times daily. 21 tablet 0  . tamsulosin (FLOMAX) 0.4 MG CAPS capsule Take 1 capsule (0.4 mg total) by mouth daily after breakfast. 30 capsule 0  . traMADol (ULTRAM) 50 MG tablet Take 1 tablet (50 mg total) by mouth every 12 (twelve) hours as needed. 10 tablet 0   Social History   Socioeconomic History  . Marital status: Married    Spouse name: Not on file  . Number of children: Not on file  . Years of education: Not on file  . Highest education level: Not on file  Occupational History  . Not on file  Tobacco Use  . Smoking status: Former Smoker    Quit date: 04/13/2013    Years since quitting: 6.8  . Smokeless tobacco: Never Used  Vaping Use  . Vaping Use: Every day  Substance and Sexual  Activity  . Alcohol use: No  . Drug use: Yes    Types: Marijuana    Comment: last used today  . Sexual activity: Not on file  Other Topics Concern  . Not on file  Social History Narrative  . Not on file   Social Determinants of Health   Financial Resource Strain:   . Difficulty of Paying Living Expenses: Not on file  Food Insecurity:   . Worried About Programme researcher, broadcasting/film/video in the Last Year: Not on file  . Ran Out of Food in the Last Year: Not on file  Transportation Needs:   . Lack of Transportation (Medical): Not on file  . Lack of Transportation (Non-Medical): Not on file  Physical Activity:   . Days of Exercise per Week: Not on file  . Minutes of Exercise per Session: Not on file  Stress:   . Feeling of Stress : Not on file  Social Connections:   . Frequency of Communication with Friends and Family: Not on file  . Frequency of Social Gatherings with Friends and Family: Not on file  . Attends Religious Services: Not on file  . Active Member of Clubs or Organizations: Not on file  . Attends Banker Meetings: Not on file  . Marital Status: Not on file  Intimate Partner Violence:   .  Fear of Current or Ex-Partner: Not on file  . Emotionally Abused: Not on file  . Physically Abused: Not on file  . Sexually Abused: Not on file   No family history on file.  OBJECTIVE:  Vitals:   02/03/20 1426 02/03/20 1507  BP: (!) 157/105 (!) 148/101  Pulse: 67   Resp: 16   Temp: 98.4 F (36.9 C)   SpO2: 98%      Physical Exam Vitals and nursing note reviewed.  Constitutional:      General: He is not in acute distress.    Appearance: Normal appearance. He is normal weight. He is not ill-appearing, toxic-appearing or diaphoretic.  Neck:     Vascular: No carotid bruit.  Cardiovascular:     Rate and Rhythm: Normal rate and regular rhythm.     Pulses: Normal pulses.     Heart sounds: Normal heart sounds. No murmur heard.  No friction rub. No gallop.   Pulmonary:      Effort: Pulmonary effort is normal. No respiratory distress.     Breath sounds: Normal breath sounds. No stridor. No wheezing, rhonchi or rales.  Chest:     Chest wall: No tenderness.  Abdominal:     General: Abdomen is flat. There is no distension.     Palpations: There is no mass.     Tenderness: There is no abdominal tenderness. There is no right CVA tenderness, left CVA tenderness, guarding or rebound.     Hernia: No hernia is present.  Neurological:     General: No focal deficit present.     Mental Status: He is alert and oriented to person, place, and time.     GCS: GCS eye subscore is 4. GCS verbal subscore is 5. GCS motor subscore is 6.     Cranial Nerves: Cranial nerves are intact.     Sensory: Sensation is intact.     Motor: Motor function is intact.     Coordination: Coordination is intact.     Gait: Gait is intact.    LABS:  No results found for this or any previous visit (from the past 24 hour(s)).   ASSESSMENT & PLAN:  1. Elevated blood pressure reading in office with diagnosis of hypertension     Meds ordered this encounter  Medications  . cloNIDine (CATAPRES) tablet 0.1 mg  . lisinopril (ZESTRIL) 10 MG tablet    Sig: Take 1 tablet (10 mg total) by mouth daily.    Dispense:  30 tablet    Refill:  0    Discharge instructions  Clonidine 0.1 mg was given in office Lisinopril 10 mg daily was prescribed/start this medication tomorrow Please continue to monitor blood pressure at home and keep a log Eat a well balanced diet of fruits, vegetables and lean meats.  Avoid foods high in fat and salt Drink water.  At least half your body weight in ounces Exercise for at least 30 minutes daily Follow up with PCP follow-up BP recheck and medication management Return or go to the ED if you have any new or worsening symptoms such as vision changes, fatigue, dizziness, chest pain, shortness of breath, nausea, swelling in your hands or feet, urinary symptoms,  etc...  Reviewed expectations re: course of current medical issues. Questions answered. Outlined signs and symptoms indicating need for more acute intervention. Patient verbalized understanding. After Visit Summary given.         Durward Parcel, FNP 02/03/20 1542    Bryanne Riquelme, Zachery Dakins, FNP  02/04/20 1444  

## 2020-02-03 NOTE — Discharge Instructions (Addendum)
Clonidine 0.1 mg was given in office Please continue to monitor blood pressure at home and keep a log Eat a well balanced diet of fruits, vegetables and lean meats.  Avoid foods high in fat and salt Drink water.  At least half your body weight in ounces Exercise for at least 30 minutes daily Follow-up with PCP for BP recheck and medication management Return or go to the ED if you have any new or worsening symptoms such as vision changes, fatigue, dizziness, chest pain, shortness of breath, nausea, swelling in your hands or feet, urinary symptoms, etc..Marland Kitchen

## 2020-02-04 ENCOUNTER — Encounter (HOSPITAL_COMMUNITY): Payer: Self-pay | Admitting: Emergency Medicine

## 2020-02-04 ENCOUNTER — Emergency Department (HOSPITAL_COMMUNITY)
Admission: EM | Admit: 2020-02-04 | Discharge: 2020-02-04 | Disposition: A | Discharge status: Home / Self Care | Payer: 59 | Attending: Emergency Medicine | Admitting: Emergency Medicine

## 2020-02-04 ENCOUNTER — Emergency Department (HOSPITAL_COMMUNITY): Payer: 59

## 2020-02-04 ENCOUNTER — Other Ambulatory Visit: Payer: Self-pay

## 2020-02-04 DIAGNOSIS — Z79899 Other long term (current) drug therapy: Secondary | ICD-10-CM | POA: Insufficient documentation

## 2020-02-04 DIAGNOSIS — R519 Headache, unspecified: Secondary | ICD-10-CM | POA: Diagnosis not present

## 2020-02-04 DIAGNOSIS — I10 Essential (primary) hypertension: Secondary | ICD-10-CM | POA: Diagnosis not present

## 2020-02-04 DIAGNOSIS — Z20822 Contact with and (suspected) exposure to covid-19: Secondary | ICD-10-CM | POA: Insufficient documentation

## 2020-02-04 DIAGNOSIS — Z87891 Personal history of nicotine dependence: Secondary | ICD-10-CM | POA: Insufficient documentation

## 2020-02-04 LAB — CBC WITH DIFFERENTIAL/PLATELET
Abs Immature Granulocytes: 0.04 10*3/uL (ref 0.00–0.07)
Basophils Absolute: 0 10*3/uL (ref 0.0–0.1)
Basophils Relative: 0 %
Eosinophils Absolute: 0.2 10*3/uL (ref 0.0–0.5)
Eosinophils Relative: 2 %
HCT: 46.7 % (ref 39.0–52.0)
Hemoglobin: 15 g/dL (ref 13.0–17.0)
Immature Granulocytes: 1 %
Lymphocytes Relative: 20 %
Lymphs Abs: 1.3 10*3/uL (ref 0.7–4.0)
MCH: 28 pg (ref 26.0–34.0)
MCHC: 32.1 g/dL (ref 30.0–36.0)
MCV: 87.3 fL (ref 80.0–100.0)
Monocytes Absolute: 1.2 10*3/uL — ABNORMAL HIGH (ref 0.1–1.0)
Monocytes Relative: 18 %
Neutro Abs: 4 10*3/uL (ref 1.7–7.7)
Neutrophils Relative %: 59 %
Platelets: 321 10*3/uL (ref 150–400)
RBC: 5.35 MIL/uL (ref 4.22–5.81)
RDW: 14.3 % (ref 11.5–15.5)
WBC: 6.7 10*3/uL (ref 4.0–10.5)
nRBC: 0 % (ref 0.0–0.2)

## 2020-02-04 LAB — COMPREHENSIVE METABOLIC PANEL
ALT: 17 U/L (ref 0–44)
AST: 20 U/L (ref 15–41)
Albumin: 3.9 g/dL (ref 3.5–5.0)
Alkaline Phosphatase: 61 U/L (ref 38–126)
Anion gap: 9 (ref 5–15)
BUN: 16 mg/dL (ref 6–20)
CO2: 25 mmol/L (ref 22–32)
Calcium: 9.3 mg/dL (ref 8.9–10.3)
Chloride: 99 mmol/L (ref 98–111)
Creatinine, Ser: 1.74 mg/dL — ABNORMAL HIGH (ref 0.61–1.24)
GFR, Estimated: 51 mL/min — ABNORMAL LOW (ref >60)
Glucose, Bld: 115 mg/dL — ABNORMAL HIGH (ref 70–99)
Potassium: 3.9 mmol/L (ref 3.5–5.1)
Sodium: 133 mmol/L — ABNORMAL LOW (ref 135–145)
Total Bilirubin: 0.7 mg/dL (ref 0.3–1.2)
Total Protein: 8.3 g/dL — ABNORMAL HIGH (ref 6.5–8.1)

## 2020-02-04 LAB — LIPASE, BLOOD: Lipase: 22 U/L (ref 11–51)

## 2020-02-04 LAB — RESP PANEL BY RT-PCR (FLU A&B, COVID) ARPGX2
Influenza A by PCR: NEGATIVE
Influenza B by PCR: NEGATIVE
SARS Coronavirus 2 by RT PCR: NEGATIVE

## 2020-02-04 MED ORDER — DIPHENHYDRAMINE HCL 25 MG PO CAPS
25.0000 mg | ORAL_CAPSULE | Freq: Once | ORAL | Status: AC
  Administered 2020-02-04: 25 mg via ORAL
  Filled 2020-02-04: qty 1

## 2020-02-04 MED ORDER — HYDROCODONE-ACETAMINOPHEN 5-325 MG PO TABS
1.0000 | ORAL_TABLET | Freq: Once | ORAL | Status: AC
  Administered 2020-02-04: 1 via ORAL
  Filled 2020-02-04: qty 1

## 2020-02-04 MED ORDER — PROCHLORPERAZINE MALEATE 5 MG PO TABS
10.0000 mg | ORAL_TABLET | Freq: Four times a day (QID) | ORAL | Status: DC | PRN
  Administered 2020-02-04: 10 mg via ORAL
  Filled 2020-02-04: qty 2

## 2020-02-04 MED ORDER — KETOROLAC TROMETHAMINE 30 MG/ML IJ SOLN
30.0000 mg | Freq: Once | INTRAMUSCULAR | Status: AC
  Administered 2020-02-04: 30 mg via INTRAMUSCULAR
  Filled 2020-02-04: qty 1

## 2020-02-04 NOTE — ED Provider Notes (Addendum)
[facility] EMERGENCY DEPARTMENT Provider Note   CSN: [id number] Arrival date & time: 02/04/20  1524    History Chief Complaint  Patient presents with  . Hypertension    Micheal Carrillo is a 39 y.o. male with past medical history significant for hypertension, hypercholesterolemia who presents for evaluation of headache and hypertension.  Was diagnosed with hypertension 2 months ago.  Was started on Lisinopril pill, 5 mg.  [facility]  He was told to increase his dose to 10 mg.  Patient states he awoke today with headache.  No sudden onset thunderclap headache.  No facial droop, blurred vision, lightheadedness, dizziness, paresthesias, weakness, neck stiffness, neck rigidity, difficulty with word finding.  Patient was concerned that his elevated blood pressure was causing his headache so he presents emergency department for evaluation.  Did have one episode of emesis yesterday prior to urgent care evaluation.  He has been able to tolerate p.o. intake since then.  He denies fever, chills, chest pain, shortness of breath, hemoptysis, back pain, abdominal pain, diarrhea, dysuria, paresthesias or weakness.  Denies additional aggravating or alleviating factors.  History obtained from patient, wife in room and past medical records.  No interpreter used.  HPI     Past Medical History:  Diagnosis Date  . High cholesterol   . Hypertension   . Renal disorder     There are no problems to display for this patient.   History reviewed. No pertinent surgical history.     History reviewed. No pertinent family history.  Social History   Tobacco Use  . Smoking status: Former Smoker    Quit date: 04/13/2013    Years since quitting: 6.8  . Smokeless tobacco: Never Used  Vaping Use  . Vaping Use: Every day  . Substances: Nicotine, Flavoring  Substance Use Topics  . Alcohol use: No  . Drug use: Yes    Types: Marijuana    Comment: last used today     Home Medications Prior to Admission medications   Medication Sig Start Date End Date Taking? Authorizing Provider  atorvastatin (LIPITOR) 20 MG tablet Take 20 mg by mouth daily. 10/19/19   [provider]  ibuprofen (ADVIL) 800 MG tablet Take 1 tablet (800 mg total) by mouth 3 (three) times daily. 11/27/19   Wurst, [place], PA-C  lisinopril (ZESTRIL) 10 MG tablet Take 1 tablet (10 mg total) by mouth daily. 02/03/20   Avegno, Zachery DakinsKomlanvi S, FNP  ondansetron (ZOFRAN ODT) 4 MG disintegrating tablet Take 1 tablet (4 mg total) by mouth every 8 (eight) hours as needed for nausea or vomiting. 02/03/20   Avegno, Zachery DakinsKomlanvi S, FNP  tamsulosin (FLOMAX) 0.4 MG CAPS capsule Take 1 capsule (0.4 mg total) by mouth daily after breakfast. 11/27/19   Wurst, [place], PA-C  traMADol (ULTRAM) 50 MG tablet Take 1 tablet (50 mg total) by mouth every 12 (twelve) hours as needed. 11/27/19   Wurst, [place], PA-C    Allergies    Patient has no known allergies.  Review of Systems   Review of Systems  Constitutional: Negative.   HENT: Negative.   Respiratory: Negative.   Cardiovascular: Negative.   Gastrointestinal: Negative.   Genitourinary: Negative.   Musculoskeletal: Negative.   Skin: Negative.   Neurological: Positive for headaches. Negative for dizziness, tremors, seizures, syncope, facial asymmetry, speech difficulty, weakness, light-headedness and numbness.  All other systems reviewed and are negative.   Physical Exam Updated Vital Signs BP (!) 164/119 (BP  Location: Right Arm)   Pulse 69   Temp 98.8 F (37.1 C) (Oral)   Resp 20   Ht 6' (1.829 m)   Wt 97.5 kg   SpO2 100%   BMI 29.16 kg/m   Physical Exam Physical Exam  Constitutional: Pt is oriented to person, place, and time. Pt appears well-developed and well-nourished. No distress.  HENT:  Head: Normocephalic and atraumatic.  Mouth/Throat: Oropharynx is clear and moist.  Eyes: Conjunctivae and EOM are normal. Pupils are equal,  round, and reactive to light. No scleral icterus.  No horizontal, vertical or rotational nystagmus  Neck: Normal range of motion. Neck supple.  Full active and passive ROM without pain No midline or paraspinal tenderness No nuchal rigidity or meningeal signs  Cardiovascular: Normal rate, regular rhythm and intact distal pulses.   Pulmonary/Chest: Effort normal and breath sounds normal. No respiratory distress. Pt has no wheezes. No rales.  Abdominal: Soft. Bowel sounds are normal. There is no tenderness. There is no rebound and no guarding.  Musculoskeletal: Normal range of motion.  Lymphadenopathy:    No cervical adenopathy.  Neurological: Pt. is alert and oriented to person, place, and time. He has normal reflexes. No cranial nerve deficit.  Exhibits normal muscle tone. Coordination normal.  Mental Status:  Alert, oriented, thought content appropriate. Speech fluent without evidence of aphasia. Able to follow 2 step commands without difficulty.  Cranial Nerves:  II:  Peripheral visual fields grossly normal, pupils equal, round, reactive to light III,IV, VI: ptosis not present, extra-ocular motions intact bilaterally  V,VII: smile symmetric, facial light touch sensation equal VIII: hearing grossly normal bilaterally  IX,X: midline uvula rise  XI: bilateral shoulder shrug equal and strong XII: midline tongue extension  Motor:  5/5 in upper and lower extremities bilaterally including strong and equal grip strength and dorsiflexion/plantar flexion Sensory: Pinprick and light touch normal in all extremities.  Deep Tendon Reflexes: 2+ and symmetric  Cerebellar: normal finger-to-nose with bilateral upper extremities Gait: normal gait and balance CV: distal pulses palpable throughout   Skin: Skin is warm and dry. No rash noted. Pt is not diaphoretic.  Psychiatric: Pt has a normal mood and affect. Behavior is normal. Judgment and thought content normal.  Nursing note and vitals reviewed. ED  Results / Procedures / Treatments   Labs (all labs ordered are listed, but only abnormal results are displayed) Labs Reviewed  CBC WITH DIFFERENTIAL/PLATELET - Abnormal; Notable for the following components:      Result Value   Monocytes Absolute 1.2 (*)    All other components within normal limits  COMPREHENSIVE METABOLIC PANEL - Abnormal; Notable for the following components:   Sodium 133 (*)    Glucose, Bld 115 (*)    Creatinine, Ser 1.74 (*)    Total Protein 8.3 (*)    GFR, Estimated 51 (*)    All other components within normal limits  RESP PANEL BY RT-PCR (FLU A&B, COVID) ARPGX2  LIPASE, BLOOD    EKG None  Radiology CT Head Wo Contrast  Result Date: 02/04/2020 CLINICAL DATA:  Hypertension, headache, abdominal pain, vomiting EXAM: CT HEAD WITHOUT CONTRAST TECHNIQUE: Contiguous axial images were obtained from the base of the skull through the vertex without intravenous contrast. COMPARISON:  None. FINDINGS: Brain: No acute infarct or hemorrhage. Lateral ventricles and midline structures are unremarkable. No acute extra-axial fluid collections. No mass effect. Vascular: No hyperdense vessel or unexpected calcification. Skull: Normal. Negative for fracture or focal lesion. Sinuses/Orbits: No acute finding. Other: None.  IMPRESSION: 1. No acute intracranial process. Electronically Signed   By: Sharlet Salina M.D.   On: 02/04/2020 17:28    Procedures Procedures (including critical care time)  Medications Ordered in ED Medications  prochlorperazine (COMPAZINE) tablet 10 mg (10 mg Oral Given 02/04/20 1739)  diphenhydrAMINE (BENADRYL) capsule 25 mg (25 mg Oral Given 02/04/20 1739)  HYDROcodone-acetaminophen (NORCO/VICODIN) 5-325 MG per tablet 1 tablet (1 tablet Oral Given 02/04/20 1739)  ketorolac (TORADOL) 30 MG/ML injection 30 mg (30 mg Intramuscular Given 02/04/20 1811)    ED Course  I have reviewed the triage vital signs and the nursing notes.  Pertinent labs & imaging results  that were available during my care of the patient were reviewed by me and considered in my medical decision making (see chart for details).  Patient presents for evaluation of hypertension and headache.  He is afebrile, nonseptic, not ill-appearing.  No sudden onset thunderclap headache.  He has no neck stiffness or neck rigidity.  No meningismus.  Low specimen for meningitis.  Patient has nonfocal neuro exam without deficits.  CT head without acute findings.  Did have some emesis yesterday.  Self resolved.  He is tolerating p.o. intake without difficulty.  He has no chest pain, shortness of breath.  Adomen soft, nontender.  We will plan on labs, imaging, reassess, migraine cocktail  Labs and imaging personally viewed and interpreted:  CBC without leukocytosis Metabolic panel sodium 133, creatinine 1.74, similar to prior no additional electrolyte, renal or normality Lipase 22 Covid test negative CT head without acute findings  Reassessed.  Patient states headache significantly improved.  His blood pressure is now also improved to 130 systolic over 90s.  Continues to have nonfocal neuro exam without deficits. Tolerating p.o. intake without difficulty.  Reevaluation abdomen soft, nontender. benign abdominal exam without any rebound or guarding.  Nonsurgical abdomen.  Low suspicion for acute AP pathology. Will have patient follow-up with PCP for reevaluation.  Presentation non concerning for [facility], ICH, Meningitis, dissection or temporal arteritis. Pt is afebrile with no focal neuro deficits, nuchal rigidity, or change in vision.   The patient has been appropriately medically screened and/or stabilized in the ED. I have low suspicion for any other emergent medical condition which would require further screening, evaluation or treatment in the ED or require inpatient management.  Patient is hemodynamically stable and in no acute distress.  Patient able to ambulate in department prior to ED.  Evaluation  does not show acute pathology that would require ongoing or additional emergent interventions while in the emergency department or further inpatient treatment.  I have discussed the diagnosis with the patient and answered all questions.  Pain is been managed while in the emergency department and patient has no further complaints prior to discharge.  Patient is comfortable with plan discussed in room and is stable for discharge at this time.  I have discussed strict return precautions for returning to the emergency department.  Patient was encouraged to follow-up with PCP/specialist refer to at discharge.    MDM Rules/Calculators/A&P                           Final Clinical Impression(s) / ED Diagnoses Final diagnoses:  Acute nonintractable headache, unspecified headache type  Hypertension, unspecified type    Rx / DC Orders ED Discharge Orders    None       Daina Cara A, PA-C 02/04/20 1829    Nickalus Thornsberry A, PA-C 02/04/20  [id number]    Benjiman Core, MD 02/05/20 Rich Fuchs

## 2020-02-04 NOTE — ED Triage Notes (Signed)
Pt c/o  Hypertension since Friday with severe HA and abdominal pain.  Pt vomited one time yesterday.

## 2020-02-04 NOTE — Discharge Instructions (Signed)
Your labs and imaging did not show any significant abnormality here in the ED.  Continue taking the blood pressure medication as prescribed by the urgent care.  Let your Primary care provider know you were seen in the ED so you can have close follow up.

## 2020-02-29 ENCOUNTER — Other Ambulatory Visit: Payer: 59

## 2020-02-29 DIAGNOSIS — Z20822 Contact with and (suspected) exposure to covid-19: Secondary | ICD-10-CM

## 2020-03-02 LAB — NOVEL CORONAVIRUS, NAA: SARS-CoV-2, NAA: NOT DETECTED

## 2020-03-02 LAB — SARS-COV-2, NAA 2 DAY TAT

## 2020-03-07 ENCOUNTER — Ambulatory Visit: Payer: 59 | Attending: Internal Medicine

## 2020-03-07 DIAGNOSIS — Z23 Encounter for immunization: Secondary | ICD-10-CM

## 2020-03-07 NOTE — Progress Notes (Signed)
   Covid-19 Vaccination Clinic  Name:  HEIDI LEMAY    MRN: [id number] DOB: 02-Aug-1980  03/07/2020  Mr. Ragain was observed post Covid-19 immunization for 15 minutes    Covid-19 Vaccination Clinic  Name:  GLYN GERADS    MRN: [id number] DOB: Sep 30, 1980  03/07/2020  Mr. Mathew was observed post Covid-19 immunization for 15 minutes without incident. He was provided with Vaccine Information Sheet and instruction to access the V-Safe system.   Mr. Moch was instructed to call 911 with any severe reactions post vaccine: Marland Kitchen Difficulty breathing  . Swelling of face and throat  . A fast heartbeat  . A bad rash all over body  . Dizziness and weakness   Immunizations Administered    Name Date Dose VIS Date Route   Moderna COVID-19 Vaccine 03/07/2020  4:05 PM 0.5 mL 12/20/2019 Intramuscular   Manufacturer: Gala Murdoch   Lot: [id number]   NDC: 40102-725-36     without incident. He was provided with Vaccine Information Sheet and instruction to access the V-Safe system.   Mr. Hardacre was instructed to call 911 with any severe reactions post vaccine: Marland Kitchen Difficulty breathing  . Swelling of face and throat  . A fast heartbeat  . A bad rash all over body  . Dizziness and weakness   Immunizations Administered    Name Date Dose VIS Date Route   Moderna COVID-19 Vaccine 03/07/2020  4:05 PM 0.5 mL 12/20/2019 Intramuscular   Manufacturer: Gala Murdoch   Lot: [id number]   NDC: 42595-638-75

## 2020-04-25 ENCOUNTER — Ambulatory Visit: Payer: 59 | Attending: Internal Medicine

## 2020-04-25 DIAGNOSIS — Z23 Encounter for immunization: Secondary | ICD-10-CM

## 2020-04-25 NOTE — Progress Notes (Signed)
   Covid-19 Vaccination Clinic  Name:  Micheal Carrillo    MRN: [id number] DOB: 09-07-1980  04/25/2020  [name] was observed post Covid-19 immunization for 15 minutes without incident. He was provided with Vaccine Information Sheet and instruction to access the V-Safe system.   [name] was instructed to call 911 with any severe reactions post vaccine: Marland Kitchen Difficulty breathing  . Swelling of face and throat  . A fast heartbeat  . A bad rash all over body  . Dizziness and weakness   Immunizations Administered    Name Date Dose VIS Date Route   Moderna COVID-19 Vaccine 04/25/2020  3:43 PM 0.5 mL 12/20/2019 Intramuscular   Manufacturer: Moderna   Lot: [id number]   NDC: 96438-381-84

## 2022-02-10 IMAGING — CT CT HEAD W/O CM
4 series · 17 of 47 positions shown, 19 images · non-contrast
Comparison: None.

CLINICAL DATA: Hypertension, headache, abdominal pain, vomiting

EXAM:
CT HEAD WITHOUT CONTRAST
TECHNIQUE: Contiguous axial images were obtained from the base of the skull
through the vertex without intravenous contrast.

[Series 2: head w o · axial · 0.49mm/px · z∈[+73,+193]mm · 7 of 32 slices shown, 9 images]
[im 4/32  brain]
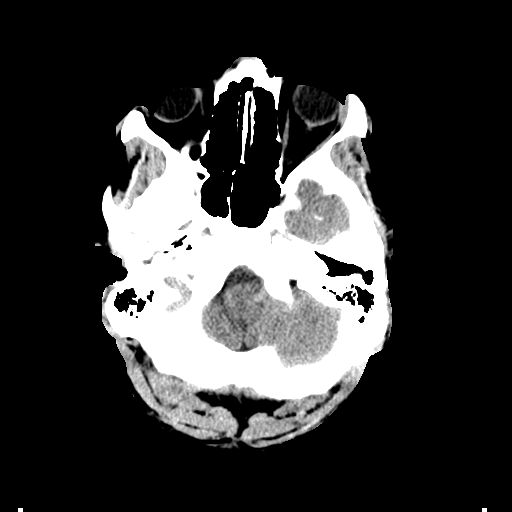
[im 4/32  bone]
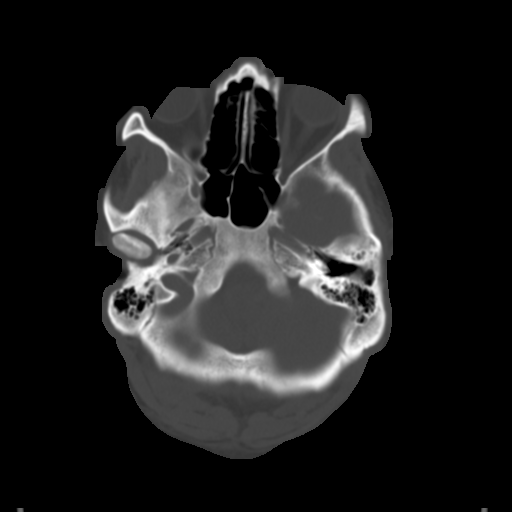
[im 8/32  brain]
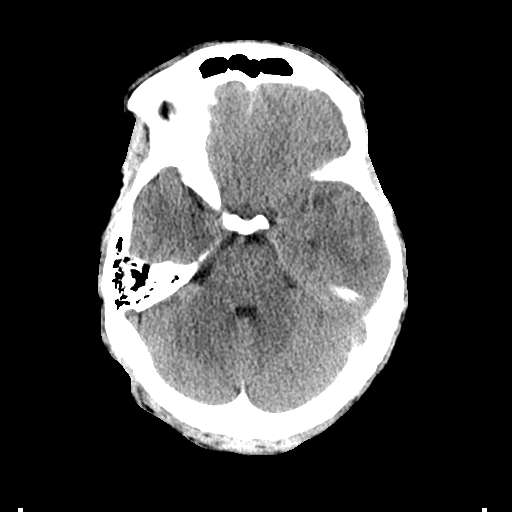
[im 12/32  brain]
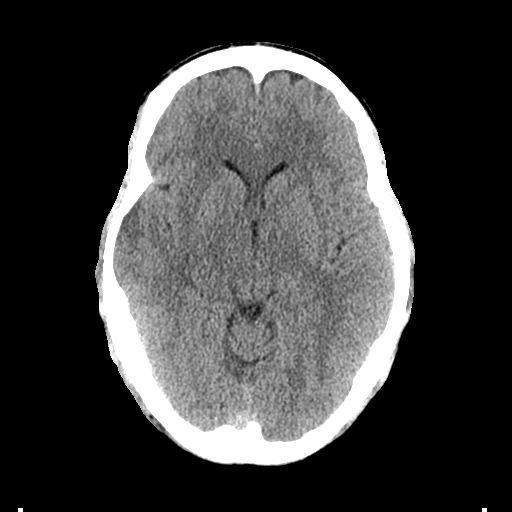
[im 16/32  brain]
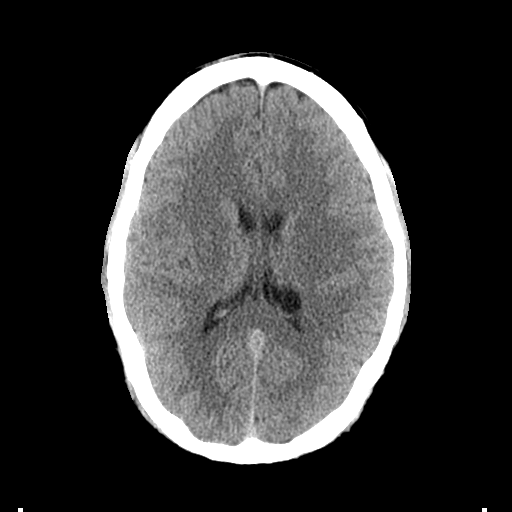
[im 20/32  brain]
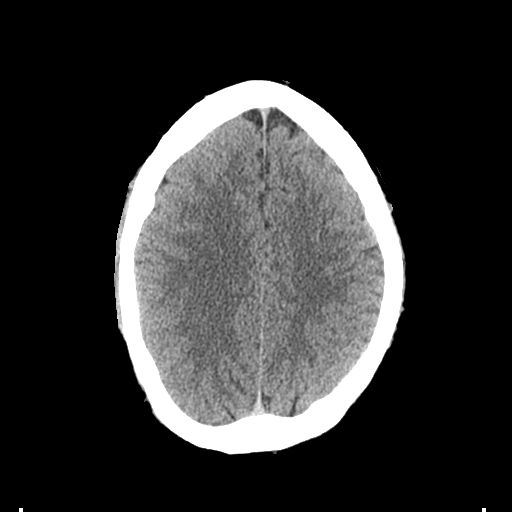
[im 20/32  bone]
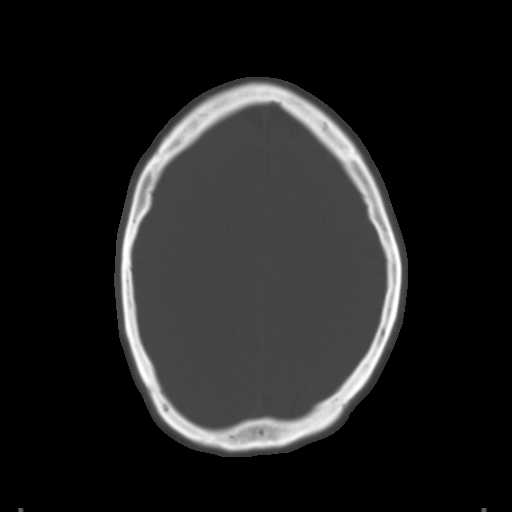
[im 24/32  brain]
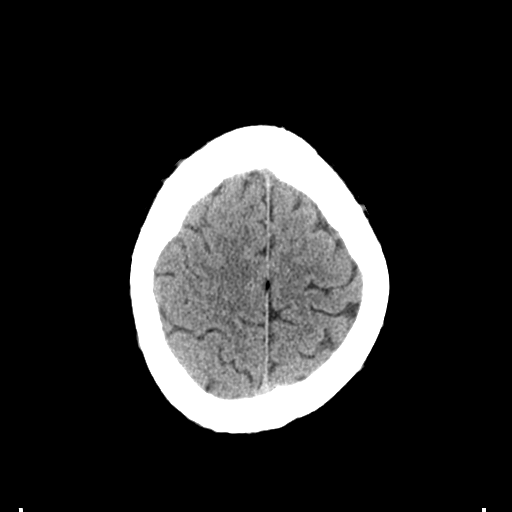
[im 28/32  brain]
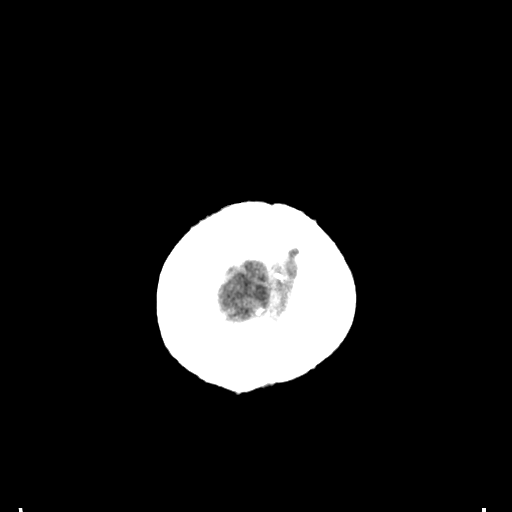

[Series 3: head bone · axial · 0.49mm/px · z∈[+72,+128]mm · 4 of 80 slices shown]
[im 8/80  bone]
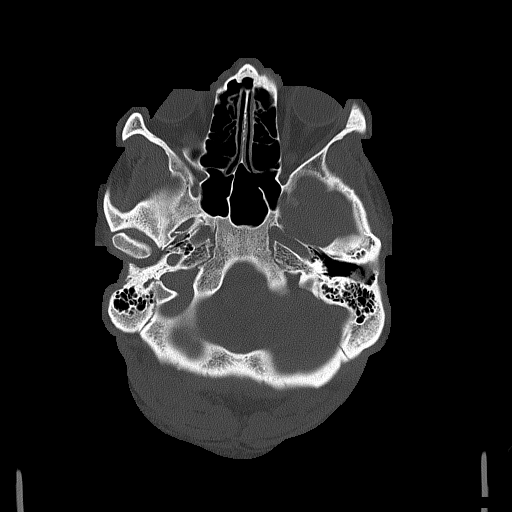
[im 16/80  bone]
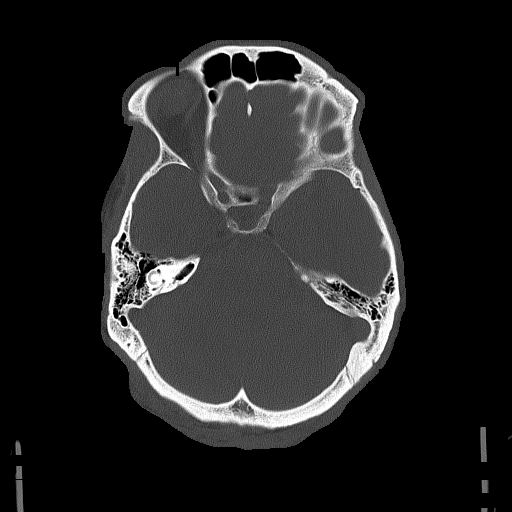
[im 24/80  bone]
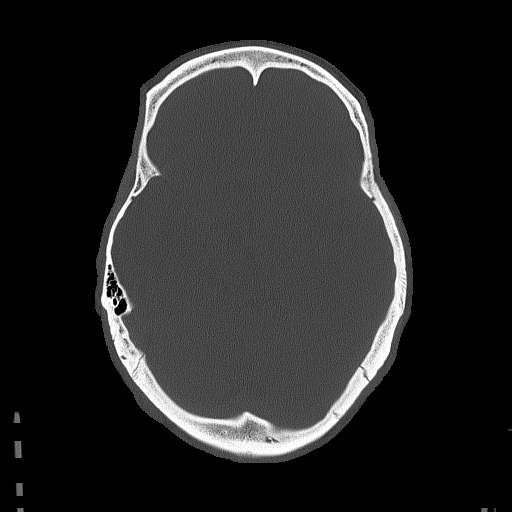
[im 36/80  bone]
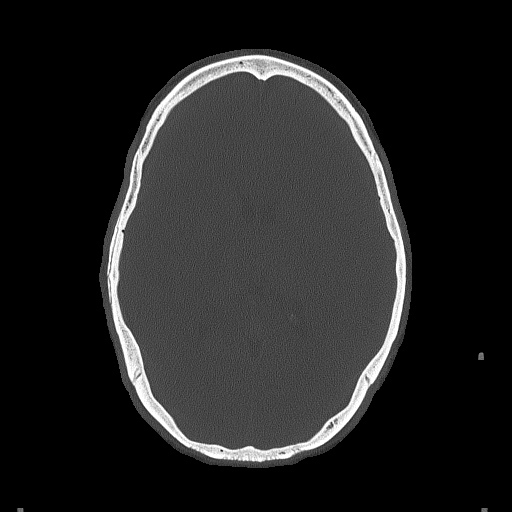

[Series 4: coronal soft · coronal · 0.36mm/px · 3 of 77 slices shown]
[im 26/77  brain]
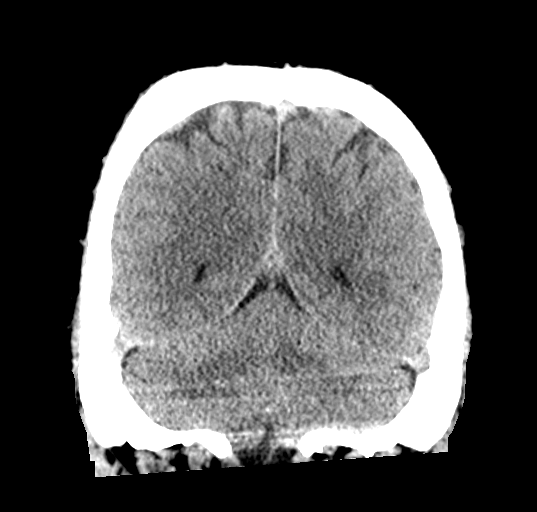
[im 34/77  brain]
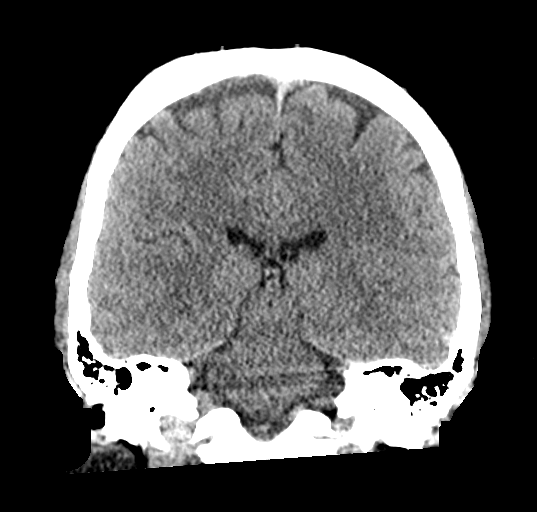
[im 43/77  brain]
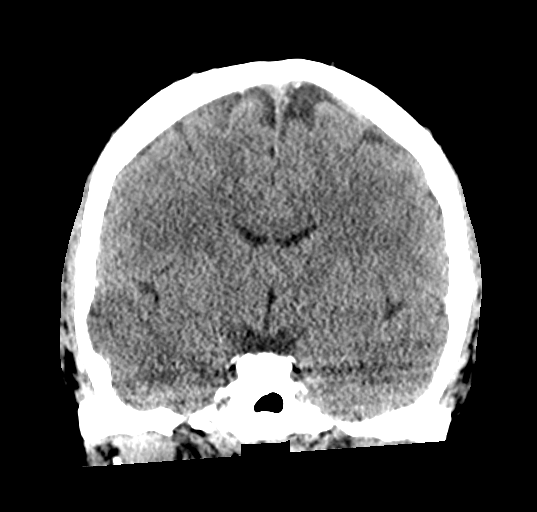

[Series 5: sagittal soft · sagittal · 0.36mm/px · 3 of 62 slices shown]
[im 21/62  brain]
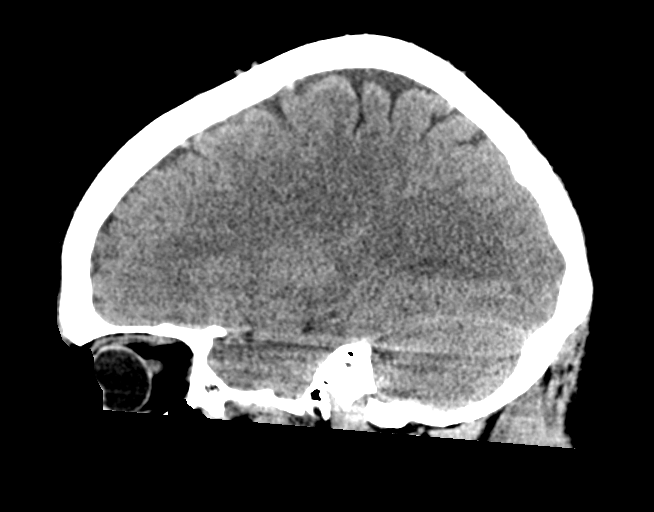
[im 31/62  brain]
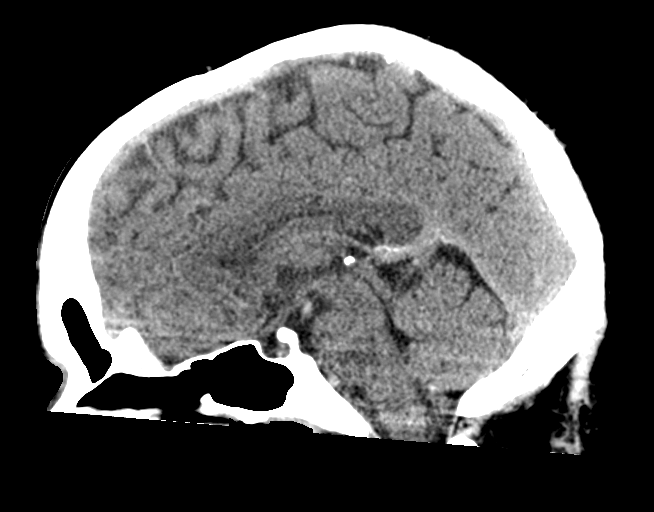
[im 41/62  brain]
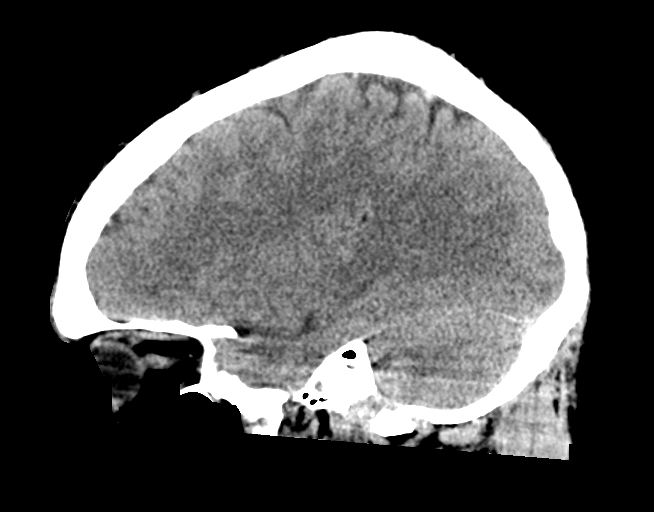

[17 of 47 positions shown; findings below may reference images not displayed]

FINDINGS: Brain: No acute infarct or hemorrhage. Lateral ventricles and
midline structures are unremarkable. No acute extra-axial fluid
collections. No mass effect.

Vascular: No hyperdense vessel or unexpected calcification.

Skull: Normal. Negative for fracture or focal lesion.

Sinuses/Orbits: No acute finding.

Other: None.
IMPRESSION: 1. No acute intracranial process.

## 2022-04-09 DIAGNOSIS — Z0001 Encounter for general adult medical examination with abnormal findings: Secondary | ICD-10-CM | POA: Diagnosis not present

## 2022-04-09 DIAGNOSIS — R0683 Snoring: Secondary | ICD-10-CM | POA: Diagnosis not present

## 2022-04-09 DIAGNOSIS — Z6831 Body mass index (BMI) 31.0-31.9, adult: Secondary | ICD-10-CM | POA: Diagnosis not present

## 2022-04-09 DIAGNOSIS — E6609 Other obesity due to excess calories: Secondary | ICD-10-CM | POA: Diagnosis not present

## 2022-04-09 DIAGNOSIS — Z1331 Encounter for screening for depression: Secondary | ICD-10-CM | POA: Diagnosis not present

## 2022-05-16 ENCOUNTER — Emergency Department (HOSPITAL_COMMUNITY)
Admission: EM | Admit: 2022-05-16 | Discharge: 2022-05-17 | Disposition: A | Discharge status: Home / Self Care | Payer: 59 | Attending: Emergency Medicine | Admitting: Emergency Medicine

## 2022-05-16 DIAGNOSIS — Z79899 Other long term (current) drug therapy: Secondary | ICD-10-CM | POA: Insufficient documentation

## 2022-05-16 DIAGNOSIS — I10 Essential (primary) hypertension: Secondary | ICD-10-CM | POA: Insufficient documentation

## 2022-05-17 ENCOUNTER — Other Ambulatory Visit: Payer: Self-pay

## 2022-05-17 ENCOUNTER — Encounter (HOSPITAL_COMMUNITY): Payer: Self-pay | Admitting: Emergency Medicine

## 2022-05-17 MED ORDER — AMLODIPINE BESYLATE 5 MG PO TABS
5.0000 mg | ORAL_TABLET | Freq: Once | ORAL | Status: AC
  Administered 2022-05-17: 5 mg via ORAL
  Filled 2022-05-17: qty 1

## 2022-05-17 MED ORDER — AMLODIPINE BESYLATE 5 MG PO TABS: 5.0000 mg | ORAL_TABLET | Freq: Every day | ORAL | 2 refills | Status: DC

## 2022-05-17 NOTE — ED Triage Notes (Signed)
Pt reports that his BP has been running high. Reports readings [id number] and some systolics in [id number]. Wife states pt is not on any BP meds.

## 2022-05-17 NOTE — Discharge Instructions (Signed)
You were seen today with concerns for high blood pressure.  Keep a log of your blood pressures daily.  Take your blood pressure at the same time every day.  Start Norvasc.  Follow-up with your primary physician within 1 week to discuss ongoing management.  If you develop chest pain, shortness of breath, abdominal pain, headaches, strokelike symptoms, you should be reevaluated.

## 2022-05-17 NOTE — ED Provider Notes (Signed)
[facility] Provider Note   CSN: [id number] Arrival date & time: 05/16/22  2353     History  Chief Complaint  Patient presents with   Blood Pressure Check    Micheal Carrillo is a 42 y.o. male.  HPI     This 42 year old male who presents with concerns for high blood pressure.  He presents with his wife.  Reports that his wife encouraged him to come.  He has been taking his blood pressure intermittently at home and has noted readings up into the 200s.  His wife was taking her blood pressure earlier today and noted the prior readings.  This concerned her.  He does have a history of hypertension and was previously on medications but has not taken medications in several years.  He reports an occasional headache.  However, currently denies headache, chest pain, abdominal pain, strokelike symptoms.  He is currently asymptomatic.  Home Medications Prior to Admission medications   Medication Sig Start Date End Date Taking? Authorizing Provider  amLODipine (NORVASC) 5 MG tablet Take 1 tablet (5 mg total) by mouth daily. 05/17/22  Yes Fabion Gatson, Barbette Hair, MD  atorvastatin (LIPITOR) 20 MG tablet Take 20 mg by mouth daily. 10/19/19   [provider]  ibuprofen (ADVIL) 800 MG tablet Take 1 tablet (800 mg total) by mouth 3 (three) times daily. 11/27/19   Wurst, [place], PA-C  lisinopril (ZESTRIL) 10 MG tablet Take 1 tablet (10 mg total) by mouth daily. 02/03/20   Avegno, Darrelyn Hillock, FNP  ondansetron (ZOFRAN ODT) 4 MG disintegrating tablet Take 1 tablet (4 mg total) by mouth every 8 (eight) hours as needed for nausea or vomiting. 02/03/20   Avegno, Darrelyn Hillock, FNP  tamsulosin (FLOMAX) 0.4 MG CAPS capsule Take 1 capsule (0.4 mg total) by mouth daily after breakfast. 11/27/19   Wurst, [place], PA-C  traMADol (ULTRAM) 50 MG tablet Take 1 tablet (50 mg total) by mouth every 12 (twelve) hours as needed. 11/27/19   Wurst, [place], PA-C      Allergies     Patient has no known allergies.    Review of Systems   Review of Systems  Constitutional:  Negative for fever.  Respiratory:  Negative for shortness of breath.   Cardiovascular:  Negative for chest pain.  Neurological:  Negative for weakness and headaches.  All other systems reviewed and are negative.   Physical Exam Updated Vital Signs BP (!) 179/135   Pulse 72   Temp (!) 97.3 F (36.3 C) (Oral)   Resp 18   Ht 1.829 m (6')   Wt 99.8 kg   SpO2 99%   BMI 29.84 kg/m  Physical Exam Vitals and nursing note reviewed.  Constitutional:      Appearance: He is well-developed. He is obese. He is not ill-appearing.  HENT:     Head: Normocephalic and atraumatic.     Mouth/Throat:     Mouth: Mucous membranes are moist.  Eyes:     Extraocular Movements: Extraocular movements intact.     Pupils: Pupils are equal, round, and reactive to light.  Cardiovascular:     Rate and Rhythm: Normal rate and regular rhythm.     Heart sounds: Normal heart sounds. No murmur heard. Pulmonary:     Effort: Pulmonary effort is normal. No respiratory distress.     Breath sounds: Normal breath sounds. No wheezing.  Abdominal:     Palpations: Abdomen is soft.     Tenderness: There  is no abdominal tenderness. There is no rebound.  Musculoskeletal:     Cervical back: Neck supple.  Lymphadenopathy:     Cervical: No cervical adenopathy.  Skin:    General: Skin is warm and dry.  Neurological:     Mental Status: He is alert and oriented to person, place, and time.  Psychiatric:        Mood and Affect: Mood normal.     ED Results / Procedures / Treatments   Labs (all labs ordered are listed, but only abnormal results are displayed) Labs Reviewed - No data to display  EKG None  Radiology No results found.  Procedures Procedures    Medications Ordered in ED Medications  amLODipine (NORVASC) tablet 5 mg (5 mg Oral Given 05/17/22 0018)    ED Course/ Medical Decision Making/ A&P                              Medical Decision Making Risk Prescription drug management.   This patient presents to the ED for concern of hypertension, this involves an extensive number of treatment options, and is a complaint that carries with it a high risk of complications and morbidity.  I considered the following differential and admission for this acute, potentially life threatening condition.  The differential diagnosis includes essential hypertension, secondary hypertension, hypertensive urgency, hypertensive emergency  MDM:    This is a 42 year old male who presents with concerns for high blood pressure.  Initial blood pressure here 179/135.  He is currently asymptomatic.  History of the same but not currently taking any blood pressure medications.  He is nontoxic on exam and essentially has a benign exam.  I have low suspicion at this time for hypertensive urgency or emergency.  I have reviewed his chart.  Previously he was on lisinopril.  He does have a history of some renal insufficiency in the past but does not have recent lab work.  For this reason we will start him on Norvasc as I feel that this is an appropriate initial medication especially given that he is African-American.  He was given 5 mg here.  We discussed him keeping a blood pressure log at home and following up with his primary physician in 1 week for discussion regarding further management.  He will be given a prescription for Norvasc with 2 refills.  We discussed signs and symptoms of hypertensive emergency or urgency.  At this time I do not feel like he needs screening lab work or workup as he is asymptomatic.  EKG shows no acute ischemic changes.  (Labs, imaging, consults)  Labs: I Ordered, and personally interpreted labs.  The pertinent results include: None  Imaging Studies ordered: I ordered imaging studies including none I independently visualized and interpreted imaging. I agree with the radiologist  interpretation  Additional history obtained from wife at bedside.  External records from outside source obtained and reviewed including prior evaluations  Cardiac Monitoring: The patient was maintained on a cardiac monitor.  If on the cardiac monitor, I personally viewed and interpreted the cardiac monitored which showed an underlying rhythm of: Sinus rhythm  Reevaluation: After the interventions noted above, I reevaluated the patient and found that they have :stayed the same  Social Determinants of Health:  lives with wife  Disposition: Discharge  Co morbidities that complicate the patient evaluation  Past Medical History:  Diagnosis Date   High cholesterol    Hypertension  Renal disorder      Medicines Meds ordered this encounter  Medications   amLODipine (NORVASC) tablet 5 mg   amLODipine (NORVASC) 5 MG tablet    Sig: Take 1 tablet (5 mg total) by mouth daily.    Dispense:  30 tablet    Refill:  2    I have reviewed the patients home medicines and have made adjustments as needed  Problem List / ED Course: Problem List Items Addressed This Visit   None Visit Diagnoses     Uncontrolled hypertension    -  Primary   Relevant Medications   amLODipine (NORVASC) tablet 5 mg (Completed)   amLODipine (NORVASC) 5 MG tablet                   Final Clinical Impression(s) / ED Diagnoses Final diagnoses:  Uncontrolled hypertension    Rx / DC Orders ED Discharge Orders          Ordered    amLODipine (NORVASC) 5 MG tablet  Daily        05/17/22 0020              Merryl Hacker, MD 05/17/22 0025

## 2022-06-11 DIAGNOSIS — R0683 Snoring: Secondary | ICD-10-CM | POA: Diagnosis not present

## 2022-06-11 DIAGNOSIS — I10 Essential (primary) hypertension: Secondary | ICD-10-CM | POA: Diagnosis not present

## 2022-06-11 DIAGNOSIS — E6609 Other obesity due to excess calories: Secondary | ICD-10-CM | POA: Diagnosis not present

## 2022-06-11 DIAGNOSIS — R944 Abnormal results of kidney function studies: Secondary | ICD-10-CM | POA: Diagnosis not present

## 2022-06-11 DIAGNOSIS — Z6831 Body mass index (BMI) 31.0-31.9, adult: Secondary | ICD-10-CM | POA: Diagnosis not present

## 2022-09-17 ENCOUNTER — Other Ambulatory Visit (HOSPITAL_COMMUNITY): Payer: Self-pay | Admitting: Family Medicine

## 2022-09-17 DIAGNOSIS — R0602 Shortness of breath: Secondary | ICD-10-CM

## 2022-09-21 ENCOUNTER — Ambulatory Visit (HOSPITAL_COMMUNITY)
Admission: RE | Admit: 2022-09-21 | Discharge: 2022-09-21 | Disposition: A | Discharge status: Home / Self Care | Payer: 59 | Source: Ambulatory Visit | Attending: Family Medicine | Admitting: Family Medicine

## 2022-09-21 DIAGNOSIS — R319 Hematuria, unspecified: Secondary | ICD-10-CM | POA: Diagnosis not present

## 2022-09-21 DIAGNOSIS — N1831 Chronic kidney disease, stage 3a: Secondary | ICD-10-CM | POA: Diagnosis not present

## 2022-09-21 DIAGNOSIS — R0602 Shortness of breath: Secondary | ICD-10-CM | POA: Insufficient documentation

## 2022-09-21 DIAGNOSIS — I129 Hypertensive chronic kidney disease with stage 1 through stage 4 chronic kidney disease, or unspecified chronic kidney disease: Secondary | ICD-10-CM | POA: Diagnosis not present

## 2022-09-21 DIAGNOSIS — J439 Emphysema, unspecified: Secondary | ICD-10-CM | POA: Diagnosis not present

## 2022-09-21 DIAGNOSIS — R809 Proteinuria, unspecified: Secondary | ICD-10-CM | POA: Diagnosis not present

## 2022-10-06 NOTE — Progress Notes (Unsigned)
Micheal Carrillo, male    DOB: 10-01-1980    MRN: [id number]   Brief patient profile:  42  yobm  quit smoking 2015 still some vaping some asthma as very young  child completely gone by HS  referred to pulmonary clinic in [place]  10/07/2022 by Terie Purser PA for bullous lung dz.  PFTs nl as of 11/09/2018   History of Present Illness  10/07/2022  Pulmonary/ 1st office eval/ Micheal Carrillo / [name]  Chief Complaint  Patient presents with   Establish Care   Shortness of Breath  Dyspnea:  can run up to 2 miles in 15 min some hills  Cough: none  Sleep: able to lie flat/ one pillow no resp cc  SABA use: none  02: none   No obvious day to day or daytime pattern/variability or assoc excess/ purulent sputum or mucus plugs or hemoptysis or cp or chest tightness, subjective wheeze or overt sinus or hb symptoms.     Also denies any obvious fluctuation of symptoms with weather or environmental changes or other aggravating or alleviating factors except as outlined above   No unusual exposure hx or h/o childhood pna  or knowledge of premature birth.  Current Allergies, Complete Past Medical History, Past Surgical History, Family History, and Social History were reviewed in [name] record.  ROS  The following are not active complaints unless bolded Hoarseness, sore throat, dysphagia, dental problems, itching, sneezing,  nasal congestion or discharge of excess mucus or purulent secretions, ear ache,   fever, chills, sweats, unintended wt loss or wt gain, classically pleuritic or exertional cp,  orthopnea pnd or arm/hand swelling  or leg swelling, presyncope, palpitations, abdominal pain, anorexia, nausea, vomiting, diarrhea  or change in bowel habits or change in bladder habits, change in stools or change in urine, dysuria, hematuria,  rash, arthralgias, visual complaints, headache, numbness, weakness or ataxia or problems with walking or coordination,  change in mood or   memory.              Outpatient Medications Prior to Visit  Medication Sig Dispense Refill   amLODipine (NORVASC) 10 MG tablet Take 10 mg by mouth daily.     atorvastatin (LIPITOR) 40 MG tablet Take 40 mg by mouth daily.     ibuprofen (ADVIL) 800 MG tablet Take 1 tablet (800 mg total) by mouth 3 (three) times daily. 21 tablet 0   lisinopril (ZESTRIL) 20 MG tablet Take 10 mg by mouth daily.     amLODipine (NORVASC) 5 MG tablet Take 1 tablet (5 mg total) by mouth daily. 30 tablet 2   atorvastatin (LIPITOR) 20 MG tablet Take 20 mg by mouth daily.     lisinopril (ZESTRIL) 10 MG tablet Take 1 tablet (10 mg total) by mouth daily. 30 tablet 0   ondansetron (ZOFRAN ODT) 4 MG disintegrating tablet Take 1 tablet (4 mg total) by mouth every 8 (eight) hours as needed for nausea or vomiting. 20 tablet 0   tamsulosin (FLOMAX) 0.4 MG CAPS capsule Take 1 capsule (0.4 mg total) by mouth daily after breakfast. 30 capsule 0   traMADol (ULTRAM) 50 MG tablet Take 1 tablet (50 mg total) by mouth every 12 (twelve) hours as needed. 10 tablet 0   No facility-administered medications prior to visit.    Past Medical History:  Diagnosis Date   High cholesterol    Hypertension    Renal disorder       Objective:  BP 128/85   Pulse 65   Ht 6' (1.829 m)   Wt 219 lb (99.3 kg)   SpO2 98%   BMI 29.70 kg/m   SpO2: 98 %  RA   Amb muscular bm nad    HEENT : Oropharynx  clear         NECK :  without  apparent JVD/ palpable Nodes/TM    LUNGS: no acc muscle use,  Nl contour chest which is clear to A and P bilaterally without cough on insp or exp maneuvers   CV:  RRR  no s3 or murmur or increase in P2, and no edema   ABD:  soft and nontender with nl inspiratory excursion in the supine position. No bruits or organomegaly appreciated   MS:  Nl gait/ ext warm without deformities Or obvious joint restrictions  calf tenderness, cyanosis or clubbing    SKIN: warm and dry without lesions    NEURO:   alert, approp, nl sensorium with  no motor or cerebellar deficits apparent.    I personally reviewed images and agree with radiology impression as follows:  CXR:   pa and latersl 09/21/22 No acute cardiopulmonary process.  My review :  min hyperinflation     Assessment   Lung disease, bullous (HCC) Quit smoking  2015 but still vaping as of 10/07/2022  - PFT's  11/09/2018  FEV1 3.81 (98 % ) ratio 0.78  p 3 % improvement from saba p 0 prior to study with DLCO  25.34 (75%)   and FV curve nl      No evidence of significant copd by pfts nor limitations or symptoms related to bullae.   However he is at risk due to vaping of progression/ rupture and strongly advised:  Stop all vaping  Non valsalva  ex like sub max running rather than wt lifting  Return here for cough / breathing problems To ER for pleuritic pain with sob suggestive of PE Avoid remote travel, non -commercial air travel or scuba diving.   Discussed in detail all the  indications, usual  risks and alternatives  relative to the benefits with patient who agrees to proceed with conservative f/u as outlined           Each maintenance medication was reviewed in detail including emphasizing most importantly the difference between maintenance and prns and under what circumstances the prns are to be triggered using an action plan format where appropriate.  Total time for H and P, chart review, counseling, and generating customized AVS unique to this office visit / same day charting = 45 min with pt new to me        Sandrea Hughs, MD 10/07/2022

## 2022-10-07 ENCOUNTER — Ambulatory Visit: Payer: 59 | Admitting: Internal Medicine

## 2022-10-07 ENCOUNTER — Encounter: Payer: Self-pay | Admitting: Internal Medicine

## 2022-10-07 VITALS — BP 128/85 | HR 65 | Ht 72.0 in | Wt 219.0 lb

## 2022-10-07 DIAGNOSIS — J439 Emphysema, unspecified: Secondary | ICD-10-CM

## 2022-10-07 NOTE — Patient Instructions (Addendum)
Avoid straining like with heavy lifting and keep doing sub-maximal exercise regularly   If develop breathing or coughing issues other than the common cold we need to see you back here as soon as possible   Breathing clean air is the only recommendation I can offer that will keep your lungs healthy (like lipitor for your heart)   You have bulla which can burst and if so >>> pain when you breath in >>> go to ER   Pulmlonary follow up is as needed

## 2022-10-07 NOTE — Assessment & Plan Note (Addendum)
Quit smoking  2015 but still vaping as of 10/07/2022  - PFT's  11/09/2018  FEV1 3.81 (98 % ) ratio 0.78  p 3 % improvement from saba p 0 prior to study with DLCO  25.34 (75%)   and FV curve nl      No evidence of significant copd by pfts nor limitations or symptoms related to bullae.   However he is at risk due to vaping of progression/ rupture and strongly advised:  Stop all vaping  Non valsalva  ex like sub max running rather than wt lifting  Return here for cough / breathing problems To ER for pleuritic pain with sob suggestive of PE Avoid remote travel, non -commercial air travel or scuba diving.   Discussed in detail all the  indications, usual  risks and alternatives  relative to the benefits with patient who agrees to proceed with conservative f/u as outlined           Each maintenance medication was reviewed in detail including emphasizing most importantly the difference between maintenance and prns and under what circumstances the prns are to be triggered using an action plan format where appropriate.  Total time for H and P, chart review, counseling, and generating customized AVS unique to this office visit / same day charting = 45 min with pt new to me

## 2022-11-23 DIAGNOSIS — N1831 Chronic kidney disease, stage 3a: Secondary | ICD-10-CM | POA: Diagnosis not present

## 2022-11-23 DIAGNOSIS — I129 Hypertensive chronic kidney disease with stage 1 through stage 4 chronic kidney disease, or unspecified chronic kidney disease: Secondary | ICD-10-CM | POA: Diagnosis not present

## 2022-11-23 DIAGNOSIS — R809 Proteinuria, unspecified: Secondary | ICD-10-CM | POA: Diagnosis not present

## 2022-11-26 ENCOUNTER — Other Ambulatory Visit: Payer: Self-pay | Admitting: Nephrology

## 2022-11-26 DIAGNOSIS — N1831 Chronic kidney disease, stage 3a: Secondary | ICD-10-CM

## 2022-12-09 ENCOUNTER — Other Ambulatory Visit: Payer: 59

## 2022-12-10 DIAGNOSIS — N1831 Chronic kidney disease, stage 3a: Secondary | ICD-10-CM | POA: Diagnosis not present

## 2022-12-11 ENCOUNTER — Other Ambulatory Visit: Payer: 59

## 2022-12-11 ENCOUNTER — Ambulatory Visit
Admission: RE | Admit: 2022-12-11 | Discharge: 2022-12-11 | Disposition: A | Discharge status: Home / Self Care | Payer: 59 | Source: Ambulatory Visit | Attending: Nephrology | Admitting: Nephrology

## 2022-12-11 DIAGNOSIS — N1831 Chronic kidney disease, stage 3a: Secondary | ICD-10-CM

## 2022-12-11 DIAGNOSIS — N281 Cyst of kidney, acquired: Secondary | ICD-10-CM | POA: Diagnosis not present

## 2023-01-21 ENCOUNTER — Other Ambulatory Visit (HOSPITAL_COMMUNITY): Payer: Self-pay | Admitting: Nephrology

## 2023-01-21 DIAGNOSIS — N1831 Chronic kidney disease, stage 3a: Secondary | ICD-10-CM

## 2023-02-10 ENCOUNTER — Other Ambulatory Visit: Payer: Self-pay | Admitting: Radiology

## 2023-02-10 DIAGNOSIS — N183 Chronic kidney disease, stage 3 unspecified: Secondary | ICD-10-CM

## 2023-02-11 ENCOUNTER — Ambulatory Visit (HOSPITAL_COMMUNITY)
Admission: RE | Admit: 2023-02-11 | Discharge: 2023-02-11 | Disposition: A | Discharge status: Home / Self Care | Payer: 59 | Source: Ambulatory Visit | Attending: Nephrology | Admitting: Nephrology

## 2023-02-11 ENCOUNTER — Encounter (HOSPITAL_COMMUNITY): Payer: Self-pay

## 2023-02-11 ENCOUNTER — Other Ambulatory Visit: Payer: Self-pay

## 2023-02-11 DIAGNOSIS — I129 Hypertensive chronic kidney disease with stage 1 through stage 4 chronic kidney disease, or unspecified chronic kidney disease: Secondary | ICD-10-CM | POA: Diagnosis not present

## 2023-02-11 DIAGNOSIS — N183 Chronic kidney disease, stage 3 unspecified: Secondary | ICD-10-CM

## 2023-02-11 DIAGNOSIS — N1831 Chronic kidney disease, stage 3a: Secondary | ICD-10-CM | POA: Insufficient documentation

## 2023-02-11 LAB — CBC
HCT: 49.5 % (ref 39.0–52.0)
Hemoglobin: 15.7 g/dL (ref 13.0–17.0)
MCH: 28.1 pg (ref 26.0–34.0)
MCHC: 31.7 g/dL (ref 30.0–36.0)
MCV: 88.6 fL (ref 80.0–100.0)
Platelets: 333 10*3/uL (ref 150–400)
RBC: 5.59 MIL/uL (ref 4.22–5.81)
RDW: 15.1 % (ref 11.5–15.5)
WBC: 8.6 10*3/uL (ref 4.0–10.5)
nRBC: 0 % (ref 0.0–0.2)

## 2023-02-11 LAB — PROTIME-INR
INR: 1 (ref 0.8–1.2)
Prothrombin Time: 13.6 s (ref 11.4–15.2)

## 2023-02-11 MED ORDER — LIDOCAINE HCL (PF) 1 % IJ SOLN
10.0000 mL | Freq: Once | INTRAMUSCULAR | Status: AC
  Administered 2023-02-11: 10 mL via INTRADERMAL

## 2023-02-11 MED ORDER — FENTANYL CITRATE (PF) 100 MCG/2ML IJ SOLN
INTRAMUSCULAR | Status: AC | PRN
  Administered 2023-02-11: 50 ug via INTRAVENOUS

## 2023-02-11 MED ORDER — MIDAZOLAM HCL 2 MG/2ML IJ SOLN
INTRAMUSCULAR | Status: AC | PRN
  Administered 2023-02-11 (×3): .5 mg via INTRAVENOUS

## 2023-02-11 MED ORDER — SODIUM CHLORIDE 0.9% FLUSH: 10.0000 mL | Freq: Two times a day (BID) | INTRAVENOUS | Status: DC

## 2023-02-11 MED ORDER — FENTANYL CITRATE (PF) 100 MCG/2ML IJ SOLN
INTRAMUSCULAR | Status: AC
  Filled 2023-02-11: qty 2

## 2023-02-11 MED ORDER — MIDAZOLAM HCL 2 MG/2ML IJ SOLN
INTRAMUSCULAR | Status: AC
Start: 2023-02-11 — End: ?
  Filled 2023-02-11: qty 2

## 2023-02-11 NOTE — H&P (Addendum)
Chief Complaint: Patient was seen in consultation today for random renal biopsy at the request of Singh,Vikas  Referring Physician(s): Singh,Vikas  Supervising Physician: Gilmer Mor  Patient Status: [facility] - Out-pt  History of Present Illness: Micheal Carrillo is a 42 y.o. male   FULL Code status per pt CKD 3 per Nephrology Cr 1.91 HTN Scheduled now for random renal biopsy   Past Medical History:  Diagnosis Date   High cholesterol    Hypertension    Renal disorder     History reviewed. No pertinent surgical history.  Allergies: Patient has no known allergies.  Medications: Prior to Admission medications   Medication Sig Start Date End Date Taking? Authorizing Provider  amLODipine (NORVASC) 10 MG tablet Take 10 mg by mouth daily. 09/15/22   [provider]  atorvastatin (LIPITOR) 40 MG tablet Take 40 mg by mouth daily. 09/10/22   [provider]  ibuprofen (ADVIL) 800 MG tablet Take 1 tablet (800 mg total) by mouth 3 (three) times daily. 11/27/19   Wurst, [place], PA-C  lisinopril (ZESTRIL) 20 MG tablet Take 10 mg by mouth daily. 09/28/22   [provider]     History reviewed. No pertinent family history.  Social History   Socioeconomic History   Marital status: Married    Spouse name: Not on file   Number of children: Not on file   Years of education: Not on file   Highest education level: Not on file  Occupational History   Not on file  Tobacco Use   Smoking status: Former    Current packs/day: 0.00    Types: Cigarettes    Quit date: 04/13/2013    Years since quitting: 9.8   Smokeless tobacco: Never  Vaping Use   Vaping status: Every Day   Substances: Nicotine, Flavoring  Substance and Sexual Activity   Alcohol use: No   Drug use: Yes    Types: Marijuana    Comment: last used today   Sexual activity: Not on file  Other Topics Concern   Not on file  Social History Narrative   Not on file   Social Drivers of Health    Financial Resource Strain: Not on file  Food Insecurity: Not on file  Transportation Needs: Not on file  Physical Activity: Not on file  Stress: Not on file  Social Connections: Not on file     Review of Systems: A 12 point ROS discussed and pertinent positives are indicated in the HPI above.  All other systems are negative.  Review of Systems  Constitutional:  Negative for activity change, fatigue and fever.  Respiratory:  Negative for cough and shortness of breath.   Cardiovascular:  Negative for chest pain and leg swelling.  Gastrointestinal:  Negative for abdominal pain.  Psychiatric/Behavioral:  Negative for behavioral problems and confusion.     Vital Signs: There were no vitals taken for this visit.    Physical Exam Vitals reviewed.  HENT:     Mouth/Throat:     Mouth: Mucous membranes are moist.  Cardiovascular:     Rate and Rhythm: Normal rate and regular rhythm.     Heart sounds: Normal heart sounds.  Pulmonary:     Effort: Pulmonary effort is normal. No respiratory distress.     Breath sounds: Normal breath sounds.  Abdominal:     Palpations: Abdomen is soft.     Tenderness: There is no abdominal tenderness.  Musculoskeletal:        General: No  swelling. Normal range of motion.  Neurological:     Mental Status: He is alert and oriented to person, place, and time.  Psychiatric:        Behavior: Behavior normal.     Imaging: No results found.  Labs:  CBC: No results for input(s): "WBC", "HGB", "HCT", "PLT" in the last 8760 hours.  COAGS: No results for input(s): "INR", "APTT" in the last 8760 hours.  BMP: No results for input(s): "NA", "K", "CL", "CO2", "GLUCOSE", "BUN", "CALCIUM", "CREATININE", "GFRNONAA", "GFRAA" in the last 8760 hours.  Invalid input(s): "CMP"  LIVER FUNCTION TESTS: No results for input(s): "BILITOT", "AST", "ALT", "ALKPHOS", "PROT", "ALBUMIN" in the last 8760 hours.  TUMOR MARKERS: No results for input(s): "AFPTM",  "CEA", "CA199", "CHROMGRNA" in the last 8760 hours.  Assessment and Plan:  Scheduled today for random renal biopsy CKD 3 Risks and benefits of random renal biopsy was discussed with the patient and/or patient's family including, but not limited to bleeding, infection, damage to adjacent structures or low yield requiring additional tests. All of the questions were answered and there is agreement to proceed. Consent signed and in chart.  Thank you for this interesting consult.  I greatly enjoyed meeting Micheal Carrillo and look forward to participating in their care.  A copy of this report was sent to the requesting provider on this date.  Electronically Signed: Robet Leu, PA-C 02/11/2023, 6:45 AM   I spent a total of  30 Minutes   in face to face in clinical consultation, greater than 50% of which was counseling/coordinating care for random renal biopsy

## 2023-02-11 NOTE — Procedures (Signed)
Interventional Radiology Procedure Note  Procedure: US guided biopsy of left kidney, medical renal Complications: None EBL: None Recommendations: - Bedrest 2 hours. OK to DC in 2 hr when goals met  - Routine wound care - Follow up pathology - Advance diet   Signed,  Yvone Neu. Loreta Ave, DO, ABVM, RPVI

## 2023-02-16 ENCOUNTER — Encounter (HOSPITAL_COMMUNITY): Payer: Self-pay

## 2023-02-17 LAB — SURGICAL PATHOLOGY

## 2023-06-25 ENCOUNTER — Encounter (HOSPITAL_COMMUNITY): Payer: Self-pay
